# Patient Record
Sex: Male | Born: 1955 | Race: Black or African American | Hispanic: No | Marital: Single | Smoking: Former smoker
Health system: Southern US, Community
[De-identification: ages and names within clinical notes are randomized; demographics above are authoritative.]

## PROBLEM LIST (undated history)

## (undated) DIAGNOSIS — B1920 Unspecified viral hepatitis C without hepatic coma: Secondary | ICD-10-CM

## (undated) DIAGNOSIS — F419 Anxiety disorder, unspecified: Secondary | ICD-10-CM

## (undated) DIAGNOSIS — F329 Major depressive disorder, single episode, unspecified: Secondary | ICD-10-CM

## (undated) DIAGNOSIS — M1990 Unspecified osteoarthritis, unspecified site: Secondary | ICD-10-CM

## (undated) DIAGNOSIS — F4310 Post-traumatic stress disorder, unspecified: Secondary | ICD-10-CM

## (undated) DIAGNOSIS — G629 Polyneuropathy, unspecified: Secondary | ICD-10-CM

## (undated) DIAGNOSIS — F32A Depression, unspecified: Secondary | ICD-10-CM

## (undated) HISTORY — DX: Polyneuropathy, unspecified: G62.9

## (undated) HISTORY — PX: NASAL SINUS SURGERY: SHX719

## (undated) HISTORY — PX: OTHER SURGICAL HISTORY: SHX169

## (undated) HISTORY — DX: Unspecified osteoarthritis, unspecified site: M19.90

---

## 1998-07-16 ENCOUNTER — Emergency Department (HOSPITAL_COMMUNITY): Admission: EM | Admit: 1998-07-16 | Discharge: 1998-07-16 | Payer: Self-pay | Admitting: Emergency Medicine

## 1999-12-02 ENCOUNTER — Inpatient Hospital Stay (HOSPITAL_COMMUNITY): Admission: EM | Admit: 1999-12-02 | Discharge: 1999-12-05 | Payer: Self-pay | Admitting: Emergency Medicine

## 1999-12-02 ENCOUNTER — Encounter: Payer: Self-pay | Admitting: Orthopedic Surgery

## 1999-12-03 ENCOUNTER — Encounter: Payer: Self-pay | Admitting: Orthopedic Surgery

## 2000-05-14 ENCOUNTER — Encounter: Payer: Self-pay | Admitting: Neurosurgery

## 2000-05-14 ENCOUNTER — Ambulatory Visit (HOSPITAL_COMMUNITY): Admission: RE | Admit: 2000-05-14 | Discharge: 2000-05-14 | Payer: Self-pay | Admitting: Neurosurgery

## 2000-08-27 ENCOUNTER — Encounter: Payer: Self-pay | Admitting: Orthopedic Surgery

## 2000-08-27 ENCOUNTER — Ambulatory Visit (HOSPITAL_COMMUNITY): Admission: RE | Admit: 2000-08-27 | Discharge: 2000-08-27 | Payer: Self-pay | Admitting: Orthopedic Surgery

## 2001-08-01 ENCOUNTER — Encounter: Payer: Self-pay | Admitting: Family Medicine

## 2001-08-01 ENCOUNTER — Ambulatory Visit (HOSPITAL_COMMUNITY): Admission: RE | Admit: 2001-08-01 | Discharge: 2001-08-01 | Payer: Self-pay | Admitting: Family Medicine

## 2001-08-10 HISTORY — PX: OTHER SURGICAL HISTORY: SHX169

## 2003-08-11 HISTORY — PX: KNEE SURGERY: SHX244

## 2005-01-08 ENCOUNTER — Encounter: Admission: RE | Admit: 2005-01-08 | Discharge: 2005-01-08 | Payer: Self-pay | Admitting: Occupational Medicine

## 2005-06-23 ENCOUNTER — Ambulatory Visit: Payer: Self-pay | Admitting: Gastroenterology

## 2005-07-24 ENCOUNTER — Ambulatory Visit: Payer: Self-pay | Admitting: Gastroenterology

## 2005-08-28 ENCOUNTER — Ambulatory Visit: Payer: Self-pay | Admitting: Gastroenterology

## 2005-09-07 ENCOUNTER — Ambulatory Visit (HOSPITAL_COMMUNITY): Admission: RE | Admit: 2005-09-07 | Discharge: 2005-09-07 | Payer: Self-pay | Admitting: Gastroenterology

## 2005-09-07 ENCOUNTER — Encounter (INDEPENDENT_AMBULATORY_CARE_PROVIDER_SITE_OTHER): Payer: Self-pay | Admitting: *Deleted

## 2008-10-20 ENCOUNTER — Emergency Department (HOSPITAL_COMMUNITY): Admission: EM | Admit: 2008-10-20 | Discharge: 2008-10-20 | Payer: Self-pay | Admitting: Emergency Medicine

## 2008-12-22 ENCOUNTER — Emergency Department (HOSPITAL_COMMUNITY): Admission: EM | Admit: 2008-12-22 | Discharge: 2008-12-22 | Payer: Self-pay | Admitting: Emergency Medicine

## 2009-04-06 ENCOUNTER — Emergency Department (HOSPITAL_COMMUNITY): Admission: EM | Admit: 2009-04-06 | Discharge: 2009-04-07 | Payer: Self-pay | Admitting: Emergency Medicine

## 2009-09-27 ENCOUNTER — Emergency Department (HOSPITAL_COMMUNITY): Admission: EM | Admit: 2009-09-27 | Discharge: 2009-09-27 | Payer: Self-pay | Admitting: Family Medicine

## 2009-12-11 ENCOUNTER — Emergency Department (HOSPITAL_COMMUNITY): Admission: EM | Admit: 2009-12-11 | Discharge: 2009-12-11 | Payer: Self-pay | Admitting: Family Medicine

## 2009-12-17 ENCOUNTER — Emergency Department (HOSPITAL_COMMUNITY): Admission: EM | Admit: 2009-12-17 | Discharge: 2009-12-17 | Payer: Self-pay | Admitting: Family Medicine

## 2009-12-23 ENCOUNTER — Emergency Department (HOSPITAL_COMMUNITY): Admission: EM | Admit: 2009-12-23 | Discharge: 2009-12-23 | Payer: Self-pay | Admitting: Family Medicine

## 2010-01-18 ENCOUNTER — Emergency Department (HOSPITAL_COMMUNITY): Admission: EM | Admit: 2010-01-18 | Discharge: 2010-01-18 | Payer: Self-pay | Admitting: Family Medicine

## 2010-06-26 ENCOUNTER — Emergency Department (HOSPITAL_COMMUNITY): Admission: EM | Admit: 2010-06-26 | Discharge: 2010-06-26 | Payer: Self-pay | Admitting: Family Medicine

## 2010-09-13 ENCOUNTER — Inpatient Hospital Stay (INDEPENDENT_AMBULATORY_CARE_PROVIDER_SITE_OTHER)
Admission: RE | Admit: 2010-09-13 | Discharge: 2010-09-13 | Disposition: A | Discharge status: Home / Self Care | Payer: Self-pay | Source: Ambulatory Visit | Attending: Family Medicine | Admitting: Family Medicine

## 2010-09-13 DIAGNOSIS — J069 Acute upper respiratory infection, unspecified: Secondary | ICD-10-CM

## 2010-10-29 LAB — POCT URINALYSIS DIP (DEVICE)
Bilirubin Urine: NEGATIVE
Glucose, UA: NEGATIVE mg/dL
Ketones, ur: NEGATIVE mg/dL
Nitrite: NEGATIVE
Protein, ur: NEGATIVE mg/dL
Specific Gravity, Urine: 1.025 (ref 1.005–1.030)
Urobilinogen, UA: 0.2 mg/dL (ref 0.0–1.0)
pH: 5 (ref 5.0–8.0)

## 2010-11-06 ENCOUNTER — Inpatient Hospital Stay (INDEPENDENT_AMBULATORY_CARE_PROVIDER_SITE_OTHER)
Admission: RE | Admit: 2010-11-06 | Discharge: 2010-11-06 | Disposition: A | Discharge status: Home / Self Care | Payer: Self-pay | Source: Ambulatory Visit | Attending: Emergency Medicine | Admitting: Emergency Medicine

## 2010-11-06 ENCOUNTER — Ambulatory Visit (INDEPENDENT_AMBULATORY_CARE_PROVIDER_SITE_OTHER): Payer: Self-pay

## 2010-11-06 DIAGNOSIS — R6889 Other general symptoms and signs: Secondary | ICD-10-CM

## 2010-11-06 DIAGNOSIS — M1990 Unspecified osteoarthritis, unspecified site: Secondary | ICD-10-CM

## 2010-11-15 LAB — CBC
MCHC: 32.9 g/dL (ref 30.0–36.0)
MCV: 89.2 fL (ref 78.0–100.0)
Platelets: 217 10*3/uL (ref 150–400)
RBC: 4.13 MIL/uL — ABNORMAL LOW (ref 4.22–5.81)
RDW: 12.6 % (ref 11.5–15.5)

## 2010-11-15 LAB — URINE CULTURE: Colony Count: >=100000

## 2010-11-15 LAB — URINALYSIS, ROUTINE W REFLEX MICROSCOPIC
Ketones, ur: NEGATIVE mg/dL
Nitrite: NEGATIVE
Specific Gravity, Urine: 1.018 (ref 1.005–1.030)
Urobilinogen, UA: 0.2 mg/dL (ref 0.0–1.0)
pH: 6 (ref 5.0–8.0)

## 2010-11-15 LAB — DIFFERENTIAL
Basophils Absolute: 0 10*3/uL (ref 0.0–0.1)
Basophils Relative: 0 % (ref 0–1)
Eosinophils Absolute: 0 10*3/uL (ref 0.0–0.7)
Neutro Abs: 12.8 10*3/uL — ABNORMAL HIGH (ref 1.7–7.7)
Neutrophils Relative %: 88 % — ABNORMAL HIGH (ref 43–77)

## 2010-11-15 LAB — GC/CHLAMYDIA PROBE AMP, URINE: GC Probe Amp, Urine: NEGATIVE

## 2010-11-15 LAB — POCT I-STAT, CHEM 8
Chloride: 101 mEq/L (ref 96–112)
Glucose, Bld: 101 mg/dL — ABNORMAL HIGH (ref 70–99)
HCT: 39 % (ref 39.0–52.0)
Hemoglobin: 13.3 g/dL (ref 13.0–17.0)
Potassium: 4 mEq/L (ref 3.5–5.1)
Sodium: 138 mEq/L (ref 135–145)

## 2010-11-15 LAB — URINE MICROSCOPIC-ADD ON

## 2010-12-26 NOTE — Discharge Summary (Signed)
[facility]  Patient:    Manuel Brady, Manuel Brady                       MRN: [id number] Adm. Date:  [id number] Disc. Date: [id number] Attending:  Loanne Drilling Dictator:   Grayland Jack, P.A.                           Discharge Summary  ADMITTING DIAGNOSIS:  Left upper extremity crush injury with both-bone forearm fracture and humerus fracture.  DISCHARGE DIAGNOSIS:  Status post open reduction internal fixation of left both-bone forearm fracture and left humerus fracture.  PROCEDURES:  1. Open reduction internal fixation of left both-bone forearm fracture.  2. Open reduction internal fixation of left humerus fracture.  SURGEON:  Ollen Gross, M.D.  ASSISTANT:  Ralene Bathe, P.A.-C.  CONSULTS:  None.  BRIEF HISTORY:  Manuel Brady is a 55 year old male who was at work when he got his left arm caught in a roller machine.  He sustained a comminuted both-bone forearm fracture, as well as a comminuted left humerus fracture.  This was consistent with a floating elbow.  It was felt that he would best benefit from surgical intervention.  Risks, benefits, and complications of surgery were discussed with the patient in detail and he agreed to proceed.  HOSPITAL COURSE:  On December 03, 1999 the patient underwent the above-stated procedure, which he tolerated well without complications.  The patient had some  postoperative pain control issues.  On postop day #1, the patient was fitted for a Sarmiento brace.  On December 05, 1999, the patient still had some moderate complaints of pain but was tolerable with p.o. analgesics.  He was afebrile and his vital signs were stable.  He was neurovascularly intact to the left upper extremity. The Sarmiento brace had a proper fit and was well tolerated by the patient.  It was  felt that he was stable for discharge at this time.  LABORATORY:  Preoperative PT and INR were within normal limits at 14.0 and 1.2,  respectively.   Preoperative PTT was 26 seconds.  Preoperative chemistries were within normal limits with a slightly decreased potassium at 3.1.  Preoperative BC was within normal limits.  Radiology:  Postoperative x-rays of the left forearm reveal near anatomic alignment of the radius and ulna after side plate application.  Postoperative films of the left humerus reveal near anatomic alignment.  CONDITION ON DISCHARGE:  Improved and stable.  DISCHARGE PLANS:  The patient is being discharged to home.  He is to have no active range of motion of the left shoulder.  He has full active range of motion of the left fingers.  He is to resume home diet.  He is to resume home medications.  DISCHARGE MEDICATIONS:  1. Percocet 5 mg #40.  2. Robaxin 500 mg #30.  DISCHARGE INSTRUCTIONS:  The patient may shower if he is able to keep the left upper extremity splint dry.  FOLLOWUP:  He is to return to the clinic in one week to follow up with Dr. Lequita Halt. Please call [phone] in the interim if there are any questions or concerns. DD:  12/05/99 TD:  12/07/99 Job: [id number]

## 2011-09-11 ENCOUNTER — Ambulatory Visit
Admission: RE | Admit: 2011-09-11 | Discharge: 2011-09-11 | Disposition: A | Discharge status: Home / Self Care | Payer: Self-pay | Source: Ambulatory Visit | Attending: Family Medicine | Admitting: Family Medicine

## 2011-09-11 ENCOUNTER — Other Ambulatory Visit: Payer: Self-pay | Admitting: Family Medicine

## 2011-09-11 ENCOUNTER — Ambulatory Visit
Admission: RE | Admit: 2011-09-11 | Discharge: 2011-09-11 | Disposition: A | Discharge status: Home / Self Care | Payer: Commercial Indemnity | Source: Ambulatory Visit | Attending: Family Medicine | Admitting: Family Medicine

## 2011-09-11 DIAGNOSIS — R52 Pain, unspecified: Secondary | ICD-10-CM

## 2013-02-11 ENCOUNTER — Emergency Department (HOSPITAL_BASED_OUTPATIENT_CLINIC_OR_DEPARTMENT_OTHER)
Admission: EM | Admit: 2013-02-11 | Discharge: 2013-02-11 | Disposition: A | Discharge status: Home / Self Care | Payer: BC Managed Care – PPO | Attending: Emergency Medicine | Admitting: Emergency Medicine

## 2013-02-11 ENCOUNTER — Encounter (HOSPITAL_BASED_OUTPATIENT_CLINIC_OR_DEPARTMENT_OTHER): Payer: Self-pay | Admitting: *Deleted

## 2013-02-11 DIAGNOSIS — R112 Nausea with vomiting, unspecified: Secondary | ICD-10-CM | POA: Insufficient documentation

## 2013-02-11 DIAGNOSIS — R51 Headache: Secondary | ICD-10-CM | POA: Insufficient documentation

## 2013-02-11 DIAGNOSIS — R6883 Chills (without fever): Secondary | ICD-10-CM | POA: Insufficient documentation

## 2013-02-11 DIAGNOSIS — K047 Periapical abscess without sinus: Secondary | ICD-10-CM | POA: Insufficient documentation

## 2013-02-11 DIAGNOSIS — R220 Localized swelling, mass and lump, head: Secondary | ICD-10-CM | POA: Insufficient documentation

## 2013-02-11 DIAGNOSIS — Z87891 Personal history of nicotine dependence: Secondary | ICD-10-CM | POA: Insufficient documentation

## 2013-02-11 DIAGNOSIS — R1110 Vomiting, unspecified: Secondary | ICD-10-CM

## 2013-02-11 DIAGNOSIS — F1410 Cocaine abuse, uncomplicated: Secondary | ICD-10-CM | POA: Insufficient documentation

## 2013-02-11 DIAGNOSIS — H9209 Otalgia, unspecified ear: Secondary | ICD-10-CM | POA: Insufficient documentation

## 2013-02-11 MED ORDER — PENICILLIN G BENZATHINE 1200000 UNIT/2ML IM SUSP
1.2000 10*6.[IU] | Freq: Once | INTRAMUSCULAR | Status: AC
  Administered 2013-02-11: 1.2 10*6.[IU] via INTRAMUSCULAR
  Filled 2013-02-11: qty 2

## 2013-02-11 MED ORDER — PENICILLIN V POTASSIUM 500 MG PO TABS: 500.0000 mg | ORAL_TABLET | Freq: Four times a day (QID) | ORAL | Status: DC

## 2013-02-11 MED ORDER — ACETAMINOPHEN 325 MG PO TABS
650.0000 mg | ORAL_TABLET | Freq: Once | ORAL | Status: AC
  Administered 2013-02-11: 650 mg via ORAL

## 2013-02-11 MED ORDER — ONDANSETRON 8 MG PO TBDP
8.0000 mg | ORAL_TABLET | Freq: Once | ORAL | Status: AC
  Administered 2013-02-11: 8 mg via ORAL
  Filled 2013-02-11: qty 1

## 2013-02-11 MED ORDER — PENICILLIN V POTASSIUM 250 MG PO TABS
500.0000 mg | ORAL_TABLET | Freq: Once | ORAL | Status: AC
  Administered 2013-02-11: 500 mg via ORAL
  Filled 2013-02-11: qty 2

## 2013-02-11 MED ORDER — TRAMADOL HCL 50 MG PO TABS: 50.0000 mg | ORAL_TABLET | Freq: Four times a day (QID) | ORAL | Status: DC | PRN

## 2013-02-11 MED ORDER — ONDANSETRON 8 MG PO TBDP: 8.0000 mg | ORAL_TABLET | Freq: Three times a day (TID) | ORAL | Status: DC | PRN

## 2013-02-11 MED ORDER — TRAMADOL HCL 50 MG PO TABS
50.0000 mg | ORAL_TABLET | Freq: Once | ORAL | Status: AC
  Administered 2013-02-11: 50 mg via ORAL
  Filled 2013-02-11: qty 1

## 2013-02-11 MED ORDER — ACETAMINOPHEN 325 MG PO TABS
ORAL_TABLET | ORAL | Status: AC
  Administered 2013-02-11: 650 mg via ORAL
  Filled 2013-02-11: qty 2

## 2013-02-11 NOTE — ED Notes (Signed)
Pt states he was seen here last p.m. and dx'd with abscessed tooth. Given Penicillin and Tramadol and now states he is having N/V, cold sweats and chills.

## 2013-02-11 NOTE — ED Provider Notes (Signed)
   History    CSN: [id number] Arrival date & time 02/11/13  4098  First MD Initiated Contact with Patient 02/11/13 0259     Chief Complaint  Patient presents with  . Headache   (Consider location/radiation/quality/duration/timing/severity/associated sxs/prior Treatment) Patient is a 57 y.o. male presenting with headaches.  Headache  Pt brought to the ED from [facility] for complaints of R lower toothache for the last 3-4 days, moderate to severe associated with earache and headache. Some facial swelling, but no fever.   History reviewed. No pertinent past medical history. Past Surgical History  Procedure Laterality Date  . Arm surgery    . Knee surgery     No family history on file. History  Substance Use Topics  . Smoking status: Former Games developer  . Smokeless tobacco: Not on file  . Alcohol Use: No     Comment: former    Review of Systems  Neurological: Positive for headaches.   All other systems reviewed and are negative except as noted in HPI.   Allergies  Review of patient's allergies indicates no known allergies.  Home Medications   Current Outpatient Rx  Name  Route  Sig  Dispense  Refill  . ibuprofen (ADVIL,MOTRIN) 200 MG tablet   Oral   Take 800 mg by mouth every 6 (six) hours as needed for pain.          BP 158/112  Pulse 73  Temp(Src) 98.2 F (36.8 C) (Oral)  Ht 6\' 1"  (1.854 m)  Wt 208 lb (94.348 kg)  BMI 27.45 kg/m2  SpO2 99% Physical Exam  Nursing note and vitals reviewed. Constitutional: He is oriented to person, place, and time. He appears well-developed and well-nourished.  HENT:  Head: Normocephalic and atraumatic.  Gingival swelling and erythema adjacent to R lower 1st molar, no palpable abscess  Eyes: EOM are normal. Pupils are equal, round, and reactive to light.  Neck: Normal range of motion. Neck supple.  Cardiovascular: Normal rate, normal heart sounds and intact distal pulses.   Pulmonary/Chest: Effort normal and breath sounds normal.   Abdominal: Bowel sounds are normal. He exhibits no distension. There is no tenderness.  Musculoskeletal: Normal range of motion. He exhibits no edema and no tenderness.  Lymphadenopathy:    He has no cervical adenopathy.  Neurological: He is alert and oriented to person, place, and time. He has normal strength. No cranial nerve deficit or sensory deficit.  Skin: Skin is warm and dry. No rash noted.  Psychiatric: He has a normal mood and affect.    ED Course  Procedures (including critical care time) Labs Reviewed - No data to display No results found. 1. Dental abscess     MDM  Pt given Toradol and PCN. Advised to follow up with Dentist although there is no dental coverage for ED patients tonight.   Shanai Lartigue B. Bernette Mayers, MD 02/11/13 0330

## 2013-02-11 NOTE — ED Notes (Signed)
C/o headache x 3 days- c/o right lower toothache, facial swelling and right ear pain x 2 days

## 2013-02-11 NOTE — ED Notes (Signed)
MD at bedside. 

## 2013-02-11 NOTE — ED Provider Notes (Signed)
History  This chart was scribed for Toy Baker, MD by Greggory Stallion, ED Scribe. This patient was seen in room MH06/MH06 and the patient's care was started at 10:03 PM.  CSN: [id number] Arrival date & time 02/11/13  2131   Chief Complaint  Patient presents with  . Medication Reaction   The history is provided by the patient. No language interpreter was used.    HPI Comments: Manuel Brady is a 57 y.o. male who presents to the Emergency Department complaining of medication reaction. Pt states he was seen here last night and diagnosed with an abscessed tooth. He states he was given Penicillin and tramadol and has now started to have HA, nausea, emesis, cold sweats, and chills. Pt states he doesn't know if he had a fever earlier or not. He states he is currently staying at [facility] for cocaine use.    History reviewed. No pertinent past medical history. Past Surgical History  Procedure Laterality Date  . Arm surgery    . Knee surgery     History reviewed. No pertinent family history. History  Substance Use Topics  . Smoking status: Former Games developer  . Smokeless tobacco: Not on file  . Alcohol Use: No     Comment: former    Review of Systems  Constitutional: Positive for chills.  Gastrointestinal: Positive for nausea and vomiting.  Neurological: Positive for headaches.  All other systems reviewed and are negative.    Allergies  Review of patient's allergies indicates no known allergies.  Home Medications   Current Outpatient Rx  Name  Route  Sig  Dispense  Refill  . ibuprofen (ADVIL,MOTRIN) 200 MG tablet   Oral   Take 800 mg by mouth every 6 (six) hours as needed for pain.         Marland Kitchen penicillin v potassium (VEETID) 500 MG tablet   Oral   Take 1 tablet (500 mg total) by mouth 4 (four) times daily.   28 tablet   0   . traMADol (ULTRAM) 50 MG tablet   Oral   Take 1 tablet (50 mg total) by mouth every 6 (six) hours as needed for pain.   15 tablet   0    BP  157/106  Pulse 87  Temp(Src) 99 F (37.2 C) (Oral)  Resp 18  Ht 6\' 1"  (1.854 m)  Wt 208 lb (94.348 kg)  BMI 27.45 kg/m2  SpO2 99%  Physical Exam  Nursing note and vitals reviewed. Constitutional: He is oriented to person, place, and time. He appears well-developed and well-nourished.  Non-toxic appearance. No distress.  HENT:  Head: Normocephalic and atraumatic.  No signs of draining abscess.   Eyes: Conjunctivae, EOM and lids are normal. Pupils are equal, round, and reactive to light.  Neck: Normal range of motion. Neck supple. No tracheal deviation present. No mass present.  No submental lymphadenopathy.   Cardiovascular: Normal rate, regular rhythm and normal heart sounds.  Exam reveals no gallop.   No murmur heard. Pulmonary/Chest: Effort normal and breath sounds normal. No stridor. No respiratory distress. He has no decreased breath sounds. He has no wheezes. He has no rhonchi. He has no rales.  Abdominal: Soft. Normal appearance and bowel sounds are normal. He exhibits no distension. There is no tenderness. There is no rebound, no guarding and no CVA tenderness.  Musculoskeletal: Normal range of motion. He exhibits no edema and no tenderness.  Lymphadenopathy:    He has no cervical adenopathy.  Neurological: He is  alert and oriented to person, place, and time. He has normal strength. No cranial nerve deficit or sensory deficit. GCS eye subscore is 4. GCS verbal subscore is 5. GCS motor subscore is 6.  Skin: Skin is warm and dry. No abrasion and no rash noted.  Psychiatric: He has a normal mood and affect. His speech is normal and behavior is normal.    ED Course  Procedures (including critical care time)  DIAGNOSTIC STUDIES: Oxygen Saturation is 99% on RA, normal by my interpretation.    COORDINATION OF CARE: 10:21 PM-Discussed treatment plan which includes a shot of penicillin here with pt at bedside and pt agreed to plan.   Labs Reviewed - No data to display No  results found. No diagnosis found.  MDM  Patient given Zofran and feels better. Given Bicillin for his tooth infection and he is stable for discharge       I personally performed the services described in this documentation, which was scribed in my presence. The recorded information has been reviewed and is accurate.    Toy Baker, MD 02/11/13 [phone]

## 2013-11-18 ENCOUNTER — Encounter (HOSPITAL_COMMUNITY): Payer: Self-pay | Admitting: Emergency Medicine

## 2013-11-18 ENCOUNTER — Emergency Department (HOSPITAL_COMMUNITY)
Admission: EM | Admit: 2013-11-18 | Discharge: 2013-11-18 | Disposition: A | Discharge status: Home / Self Care | Payer: Commercial Indemnity | Attending: Emergency Medicine | Admitting: Emergency Medicine

## 2013-11-18 DIAGNOSIS — Z8619 Personal history of other infectious and parasitic diseases: Secondary | ICD-10-CM | POA: Insufficient documentation

## 2013-11-18 DIAGNOSIS — R61 Generalized hyperhidrosis: Secondary | ICD-10-CM | POA: Insufficient documentation

## 2013-11-18 DIAGNOSIS — N509 Disorder of male genital organs, unspecified: Secondary | ICD-10-CM | POA: Insufficient documentation

## 2013-11-18 DIAGNOSIS — R350 Frequency of micturition: Secondary | ICD-10-CM | POA: Insufficient documentation

## 2013-11-18 DIAGNOSIS — M549 Dorsalgia, unspecified: Secondary | ICD-10-CM | POA: Insufficient documentation

## 2013-11-18 DIAGNOSIS — Z87891 Personal history of nicotine dependence: Secondary | ICD-10-CM | POA: Insufficient documentation

## 2013-11-18 HISTORY — DX: Unspecified viral hepatitis C without hepatic coma: B19.20

## 2013-11-18 LAB — BASIC METABOLIC PANEL
BUN: 12 mg/dL (ref 6–23)
CALCIUM: 9 mg/dL (ref 8.4–10.5)
CO2: 25 meq/L (ref 19–32)
CREATININE: 0.77 mg/dL (ref 0.50–1.35)
Chloride: 102 mEq/L (ref 96–112)
GFR calc Af Amer: >90 mL/min (ref >90)
GFR calc non Af Amer: >90 mL/min (ref >90)
GLUCOSE: 101 mg/dL — AB (ref 70–99)
Potassium: 3.6 mEq/L — ABNORMAL LOW (ref 3.7–5.3)
Sodium: 140 mEq/L (ref 137–147)

## 2013-11-18 LAB — URINALYSIS, ROUTINE W REFLEX MICROSCOPIC
Bilirubin Urine: NEGATIVE
GLUCOSE, UA: NEGATIVE mg/dL
HGB URINE DIPSTICK: NEGATIVE
KETONES UR: NEGATIVE mg/dL
Leukocytes, UA: NEGATIVE
Nitrite: NEGATIVE
PROTEIN: NEGATIVE mg/dL
Specific Gravity, Urine: 1.014 (ref 1.005–1.030)
Urobilinogen, UA: 0.2 mg/dL (ref 0.0–1.0)
pH: 6.5 (ref 5.0–8.0)

## 2013-11-18 LAB — CBC WITH DIFFERENTIAL/PLATELET
BASOS ABS: 0 10*3/uL (ref 0.0–0.1)
Basophils Relative: 0 % (ref 0–1)
EOS PCT: 2 % (ref 0–5)
Eosinophils Absolute: 0.1 10*3/uL (ref 0.0–0.7)
HEMATOCRIT: 41.7 % (ref 39.0–52.0)
Hemoglobin: 14 g/dL (ref 13.0–17.0)
LYMPHS PCT: 48 % — AB (ref 12–46)
Lymphs Abs: 2.7 10*3/uL (ref 0.7–4.0)
MCH: 29.3 pg (ref 26.0–34.0)
MCHC: 33.6 g/dL (ref 30.0–36.0)
MCV: 87.2 fL (ref 78.0–100.0)
MONO ABS: 0.5 10*3/uL (ref 0.1–1.0)
MONOS PCT: 8 % (ref 3–12)
Neutro Abs: 2.4 10*3/uL (ref 1.7–7.7)
Neutrophils Relative %: 42 % — ABNORMAL LOW (ref 43–77)
Platelets: 203 10*3/uL (ref 150–400)
RBC: 4.78 MIL/uL (ref 4.22–5.81)
RDW: 12.9 % (ref 11.5–15.5)
WBC: 5.7 10*3/uL (ref 4.0–10.5)

## 2013-11-18 NOTE — ED Provider Notes (Signed)
Medical screening examination/treatment/procedure(s) were performed by non-physician practitioner and as supervising physician I was immediately available for consultation/collaboration.   EKG Interpretation None      Djuana Littleton, MD, FACEP   Kadien Lineman L Tynisa Vohs, MD 11/18/13 0832 

## 2013-11-18 NOTE — Discharge Instructions (Signed)
Follow up with a primary care provider from the resource guide below. Refer to attached documents for more information. Return to the ED with worsening or concerning symptoms.    Emergency Department Resource Guide 1) Find a Doctor and Pay Out of Pocket Although you won't have to find out who is covered by your insurance plan, it is a good idea to ask around and get recommendations. You will then need to call the office and see if the doctor you have chosen will accept you as a new patient and what types of options they offer for patients who are self-pay. Some doctors offer discounts or will set up payment plans for their patients who do not have insurance, but you will need to ask so you aren't surprised when you get to your appointment.  2) Contact Your Local Health Department Not all health departments have doctors that can see patients for sick visits, but many do, so it is worth a call to see if yours does. If you don't know where your local health department is, you can check in your phone book. The CDC also has a tool to help you locate your state's health department, and many state websites also have listings of all of their local health departments.  3) Find a [facility] If your illness is not likely to be very severe or complicated, you may want to try a walk in clinic. These are popping up all over the country in pharmacies, drugstores, and shopping centers. They're usually staffed by nurse practitioners or physician assistants that have been trained to treat common illnesses and complaints. They're usually fairly quick and inexpensive. However, if you have serious medical issues or chronic medical problems, these are probably not your best option.  No Primary Care Doctor: - Call Health Connect at  [phone] - they can help you locate a primary care doctor that  accepts your insurance, provides certain services, etc. - Physician Referral Service- [phone]  Chronic Pain  Problems: Organization         Address  Phone   Notes  [facility]  [phone] Patients need to be referred by their primary care doctor.   Medication Assistance: Organization         Address  Phone   Notes  [facility] Medication [facility] [address] [place], [place] [address] [phone] --Must be a resident of [facility] -- Must have NO insurance coverage whatsoever (no Medicaid/ Medicare, etc.) -- The pt. MUST have a primary care doctor that directs their care regularly and follows them in the community   MedAssist  [phone]   [employer]  [phone]    Agencies that provide inexpensive medical care: Organization         Address  Phone   Notes  [place]  [phone]   [facility]    [phone]   [facility] [place], [place] [address] [phone]   [place] [address] [place] [phone]   Planned Parenthood    [phone]   [facility]    [phone]   [place] and [place] [address], [place] Phone:  [phone], Fax:  [phone] Hours of Operation:  9 am - 6 pm, M-F.  Also accepts Medicaid/Medicare and self-pay.  [facility] for [place] [address]  [place], [place], [place] Phone: [phone], Fax: [phone]. Hours of Operation:  8:30 am - 5:30 pm, M-F.  Also accepts Medicaid and self-pay.  [facility] [place] [address], [place] Phone: [phone]   [place], [place], [place] [phone], Ext. [phone] Mondays & Thursdays: 7-9 AM.  First 15 patients are seen on a first come, first serve basis.    [place] Providers:  Organization         Address  Phone   Notes  [facility] [address], [place] [phone] Also  accepts self-pay patients.  [facility] [address], [place]  [phone]   [place], [address], [place] [phone]   [facility] [address] [place] [phone]   Lucianne Lei [address], [place]   [phone] Only accepts [place] Access [place] patients after they have their name applied to their card.   Self-Pay (no insurance) in [facility]:  Organization         Address  Phone   Notes  Sickle Cell Patients, [facility] [place] [phone]   [facility] Care [place] [phone]   [facility] [place]  [place], [place],  [phone]   [facility]/Dr. Osei-Bonsu  [address] [place] or [address], [place] [phone] Phone number for both [place] and [place] locations is the same.  Urgent Medical and [facility] [address], [place] [phone]   [facility] [address] [place] or [address] [phone]   [facility] [address], [place] [phone], phone; [phone], fax Sees patients 1st and 3rd Saturday of every month.  Must not qualify for public or private insurance (i.e. Medicaid, Medicare, [place] Health Choice, Veterans' Benefits)  Household income should be no more than 200% of the poverty level The clinic cannot treat you if you are pregnant or think you are pregnant  Sexually transmitted diseases are not treated at the clinic.    Dental Care: Organization         Address  Phone  Notes  [facility] Department of [facility] [place] [phone] Accepts children up to age 34 who are enrolled in [place] or [place]; pregnant  women with a Medicaid card; and children who have applied for Medicaid or [place] Health Choice, but were declined, whose parents can pay a reduced fee at time of service.  [facility] Department of [facility]  [address], [place] [phone] Accepts children up to age 24 who are enrolled in [place] or [place]; pregnant women with a Medicaid card; and children who have applied for Medicaid or [place] Health Choice, but were declined, whose parents can pay a reduced fee at time of service.  [facility] Adult Dental Access PROGRAM  [place] [phone] Patients are seen by appointment only. Walk-ins are not accepted. [place] will see patients 57 years of age and older. Monday - Tuesday (8am-5pm) Most Wednesdays (8:30-5pm) $30 per visit, cash only  [place]  [address], [place] [phone] Patients are seen by appointment only. Walk-ins are not accepted. [place] will see patients 47 years of age and older. One Wednesday Evening (Monthly: Volunteer Based).  $30 per visit, cash only  [place]  [phone] for adults; Children under age 20, call Graduate Pediatric Dentistry at [phone]. Children aged 75-14, please call [phone] to request a pediatric application.  Dental services are provided in all areas of dental care including fillings, crowns and bridges, complete and partial dentures, implants, gum treatment, root canals, and extractions. Preventive care is also provided. Treatment is provided to both adults and children. Patients are selected via a lottery and there is often a waiting list.   [facility] [address], [place]  [phone] [email or web address]   [facility] [address] [place], [place] [phone], Ext. [phone] Second and Fourth Thursday of each month, opens at 6:30 AM; Clinic ends at 9 AM.  Patients are  seen on a first-come first-served basis, and a limited number are seen during each clinic.   [facility]  [address] Hillard Danker [place], [place] [phone]   Eligibility Requirements You must have lived in [place], [place], or [place] counties for at least the last three months.   You cannot be eligible for state or federal sponsored [employer], including [employer], [place], or [employer].   You generally cannot be eligible for healthcare insurance through your employer.    How to apply: Eligibility screenings are held every Tuesday and Wednesday afternoon from 1:00 pm until 4:00 pm. You do not need an appointment for the interview!  [facility] [place] [address] [place], [place]   [place]  [place] Department  [place]  [phone]    Behavioral Health Resources in the Community: Intensive Outpatient Programs Organization         Address  Phone  Notes  [place]. [address] [place], [place] [phone]   [facility] Outpatient [address] [place], [place]   ADS: Alcohol & Drug Svcs [address] [place], [place]   [place] [address]  [place], [place] or [phone]   Substance Abuse Resources Organization         Address  Phone  Notes  Alcohol and Drug Services  [phone]   [place]  [phone]   The [place]   Chinita Pester  [phone]   Residential & Outpatient Substance Abuse Program  [phone]   Psychological Services Organization         Address  Phone  Notes  [facility] [place]  [place]  [phone]   [place] [address] [place] or [phone]    Mobile Crisis  Teams Organization         Address  Phone  Notes  [facility]rapeutic Alternatives, Mobile Crisis Care Unit  [phone]   [facility]ive Psychotherapeutic Services  [address] [place], [place]   [place] [address] [place] [phone]    Self-Help/Support Groups Organization         Address  Phone             Notes  [place]. of [place] -  variety of support groups  [phone]- [phone] Call for more information  Narcotics Anonymous (NA), [facility] [address], [employer] [place]  2 meetings at this location   Residential Facilities manager         Address  Phone  Notes  [facility]idential Treatment [place],    [place]  [phone]   [facility]  [address] [place] 623762, [place], [place]   [place], [place] [phone] Admissions: 8am-3pm M-F  Incentives Substance [place] [address],    [place], [place] [phone]   [facility] [address] [place], [place], [place]   [facility] [address]  [place], [place]   Insight Programs - Intensive Outpatient [address]een [address], [place], [place]   [facility] [place] ([place].,  [place], [place] [phone] or [phone]   Residential Treatment Services (RTS) [address] [place], [place] Accepts Medicaid  [facility] [place] [address],  [place] [phone] Substance Abuse/Addiction Treatment   [facility] Organization         Address  Phone  Notes  [facility]  [phone]   Domenic Schwab, PhD [address] [place], [place]   [phone] or [phone]   [place], [place] [phone]   [facility] [address] [place], [place] [phone]  Insurance/Medicaid/sponsorship through [facility] and Families [address]                                    [place], [place] [phone] [place] [address], [place] [phone]    Dr. Adele Schilder  [phone]   [facility] Dept. 1) [address] [place] 2) [place] 3)  [place] [address], [place] [phone]  [phone]   [place] [phone] or [phone] (After Hours)

## 2013-11-18 NOTE — ED Notes (Signed)
Patient reports urinary frequency. Reports waking up in sweats for the last few nights. Denies Hematuria. States he does have some right back pain.

## 2013-11-18 NOTE — ED Provider Notes (Signed)
CSN: [id number]     Arrival date & time 11/18/13  3762 History   First MD Initiated Contact with Patient 11/18/13 [phone]     Chief Complaint  Patient presents with  . Urinary Tract Infection     (Consider location/radiation/quality/duration/timing/severity/associated sxs/prior Treatment) HPI Comments: Patient is a 58 year old male with a past medical history of hepatitis C who presents with increased urinary frequency for the past 3 days. Symptoms started gradually and progressively worsened since the onset. Patient reports noticing the increased frequency mostly at night. Patient reports some occasional, mild testicular pain and some right back pain. No aggravating/alleviating factors. No other associated symptoms. Patient denies any history of kidney stones.    Past Medical History  Diagnosis Date  . Hepatitis C    Past Surgical History  Procedure Laterality Date  . Arm surgery    . Knee surgery     No family history on file. History  Substance Use Topics  . Smoking status: Former Research scientist (life sciences)  . Smokeless tobacco: Not on file  . Alcohol Use: No     Comment: former    Review of Systems  Constitutional: Positive for diaphoresis. Negative for fever, chills and fatigue.  HENT: Negative for trouble swallowing.   Eyes: Negative for visual disturbance.  Respiratory: Negative for shortness of breath.   Cardiovascular: Negative for chest pain and palpitations.  Gastrointestinal: Negative for nausea, vomiting, abdominal pain and diarrhea.  Genitourinary: Positive for frequency. Negative for dysuria and difficulty urinating.  Musculoskeletal: Negative for arthralgias and neck pain.  Skin: Negative for color change.  Neurological: Negative for dizziness and weakness.  Psychiatric/Behavioral: Negative for dysphoric mood.      Allergies  Review of patient's allergies indicates no known allergies.  Home Medications   Current Outpatient Rx  Name  Route  Sig  Dispense  Refill  .  ibuprofen (ADVIL,MOTRIN) 200 MG tablet   Oral   Take 400 mg by mouth every 6 (six) hours as needed.         . Pseudoephedrine-Acetaminophen (SINUS PO)   Oral   Take 1 tablet by mouth 2 (two) times daily as needed (for sinuses).          BP 128/92  Pulse 72  Temp(Src) 97.6 F (36.4 C) (Oral)  Resp 16  SpO2 94% Physical Exam  Nursing note and vitals reviewed. Constitutional: He is oriented to person, place, and time. He appears well-developed and well-nourished. No distress.  HENT:  Head: Normocephalic and atraumatic.  Eyes: Conjunctivae and EOM are normal.  Neck: Normal range of motion.  Cardiovascular: Normal rate and regular rhythm.  Exam reveals no gallop and no friction rub.   No murmur heard. Pulmonary/Chest: Effort normal and breath sounds normal. He has no wheezes. He has no rales. He exhibits no tenderness.  Abdominal: Soft. He exhibits no distension. There is no tenderness. There is no rebound and no guarding.  No tenderness to palpation.   Genitourinary: Penis normal.  No CVA tenderness. No testicular tenderness to palpation or scrotal swelling noted.   Musculoskeletal: Normal range of motion.  Neurological: He is alert and oriented to person, place, and time. Coordination normal.  Speech is goal-oriented. Moves limbs without ataxia.   Skin: Skin is warm and dry.  Psychiatric: He has a normal mood and affect. His behavior is normal.    ED Course  Procedures (including critical care time) Labs Review Labs Reviewed  CBC WITH DIFFERENTIAL - Abnormal; Notable for the following:  Neutrophils Relative % 42 (*)    Lymphocytes Relative 48 (*)    All other components within normal limits  BASIC METABOLIC PANEL - Abnormal; Notable for the following:    Potassium 3.6 (*)    Glucose, Bld 101 (*)    All other components within normal limits  URINE CULTURE  URINALYSIS, ROUTINE W REFLEX MICROSCOPIC   Imaging Review No results found.   EKG Interpretation None       MDM   Final diagnoses:  Urinary frequency    8:19 AM Patient's labs and urinalysis unremarkable for acute changes. Patient reports mild pain. No testicular tenderness to palpation. Patient has no signs of infection. Patient does not want to have a CT abdomen pelvis for further evaluation of kidney stone. Patient will be discharged with instructions to follow up with a PCP from the resource guide. Patient advised to return with worsening or concerning symptoms.    Alvina Chou, PA-C 11/18/13 [address] [place], PA-C 11/18/13 [phone]

## 2013-11-19 LAB — URINE CULTURE
Colony Count: 4000
SPECIAL REQUESTS: NORMAL

## 2013-12-30 ENCOUNTER — Encounter (HOSPITAL_COMMUNITY): Payer: Self-pay | Admitting: Emergency Medicine

## 2013-12-30 ENCOUNTER — Emergency Department (HOSPITAL_COMMUNITY)
Admission: EM | Admit: 2013-12-30 | Discharge: 2013-12-30 | Disposition: A | Discharge status: Home / Self Care | Payer: Commercial Indemnity | Attending: Emergency Medicine | Admitting: Emergency Medicine

## 2013-12-30 DIAGNOSIS — W268XXA Contact with other sharp object(s), not elsewhere classified, initial encounter: Secondary | ICD-10-CM | POA: Insufficient documentation

## 2013-12-30 DIAGNOSIS — Z87891 Personal history of nicotine dependence: Secondary | ICD-10-CM | POA: Insufficient documentation

## 2013-12-30 DIAGNOSIS — L03113 Cellulitis of right upper limb: Secondary | ICD-10-CM

## 2013-12-30 DIAGNOSIS — Y939 Activity, unspecified: Secondary | ICD-10-CM | POA: Insufficient documentation

## 2013-12-30 DIAGNOSIS — S60559A Superficial foreign body of unspecified hand, initial encounter: Principal | ICD-10-CM

## 2013-12-30 DIAGNOSIS — Z8619 Personal history of other infectious and parasitic diseases: Secondary | ICD-10-CM | POA: Insufficient documentation

## 2013-12-30 DIAGNOSIS — S60551A Superficial foreign body of right hand, initial encounter: Secondary | ICD-10-CM

## 2013-12-30 DIAGNOSIS — Y990 Civilian activity done for income or pay: Secondary | ICD-10-CM | POA: Insufficient documentation

## 2013-12-30 DIAGNOSIS — Y929 Unspecified place or not applicable: Secondary | ICD-10-CM | POA: Insufficient documentation

## 2013-12-30 DIAGNOSIS — L089 Local infection of the skin and subcutaneous tissue, unspecified: Secondary | ICD-10-CM | POA: Insufficient documentation

## 2013-12-30 MED ORDER — CEPHALEXIN 500 MG PO CAPS: 500.0000 mg | ORAL_CAPSULE | Freq: Four times a day (QID) | ORAL | Status: DC

## 2013-12-30 MED ORDER — SULFAMETHOXAZOLE-TMP DS 800-160 MG PO TABS
1.0000 | ORAL_TABLET | Freq: Once | ORAL | Status: AC
  Administered 2013-12-30: 1 via ORAL
  Filled 2013-12-30: qty 1

## 2013-12-30 MED ORDER — SULFAMETHOXAZOLE-TRIMETHOPRIM 800-160 MG PO TABS: 1.0000 | ORAL_TABLET | Freq: Two times a day (BID) | ORAL | Status: DC

## 2013-12-30 MED ORDER — CEPHALEXIN 250 MG PO CAPS
500.0000 mg | ORAL_CAPSULE | Freq: Once | ORAL | Status: AC
  Administered 2013-12-30: 500 mg via ORAL
  Filled 2013-12-30: qty 2

## 2013-12-30 NOTE — ED Provider Notes (Signed)
CSN: [id number]     Arrival date & time 12/30/13  1634 History  This chart was scribed for non-physician practitioner working with Neta Ehlers, MD by Mercy Moore, ED Scribe. This patient was seen in room TR09C/TR09C and the patient's care was started at 5:16 PM.   Chief Complaint  Patient presents with  . Hand Injury      The history is provided by the patient. No language interpreter was used.   HPI Comments: Manuel Brady is a 58 y.o. male who presents to the Emergency Department with a right hand injury that occurred two days ago at work. Patient reports that two large wooden splinters punctured his hand though he was wearing gloves. Patient was able to remove one the splinters immediately following the accident. Patient treated his wound with rubbing alcohol and peroxide at home. Today the second splinter ermerged with some drainage. Patient presents with an open wound, swelling and redness at the puncture site. Patient is right hand dominant.   Patient shares he does not have insurance and is concerned about the cost of any antibiotics.   Last Tetanus vaccination received 1.5 years ago.   Past Medical History  Diagnosis Date  . Hepatitis C    Past Surgical History  Procedure Laterality Date  . Arm surgery    . Knee surgery     History reviewed. No pertinent family history. History  Substance Use Topics  . Smoking status: Former Research scientist (life sciences)  . Smokeless tobacco: Not on file  . Alcohol Use: No     Comment: former    Review of Systems  A complete 10 system review of systems was obtained and all systems are negative except as noted in the HPI and PMH.    Allergies  Review of patient's allergies indicates no known allergies.  Home Medications   Prior to Admission medications   Medication Sig Start Date End Date Taking? Authorizing Provider  ibuprofen (ADVIL,MOTRIN) 200 MG tablet Take 400 mg by mouth every 6 (six) hours as needed.    Historical Provider, MD   Pseudoephedrine-Acetaminophen (SINUS PO) Take 1 tablet by mouth 2 (two) times daily as needed (for sinuses).    Historical Provider, MD   Triage Vitals: BP 130/80  Pulse 84  Temp(Src) 98 F (36.7 C) (Oral)  Resp 18  Ht 6\' 2"  (1.88 m)  Wt 215 lb (97.523 kg)  BMI 27.59 kg/m2  SpO2 95% Physical Exam  Nursing note and vitals reviewed. Constitutional: He is oriented to person, place, and time. He appears well-developed and well-nourished. No distress.  HENT:  Head: Normocephalic.  Eyes: Conjunctivae and EOM are normal.  Cardiovascular: Normal rate.   Pulmonary/Chest: Effort normal. No stridor.  Musculoskeletal: Normal range of motion.       Hands: Excellent range of motion. Neurovascularly intact, compartments soft  Neurological: He is alert and oriented to person, place, and time.  Psychiatric: He has a normal mood and affect.    ED Course  Procedures (including critical care time)  INCISION AND DRAINAGE PROCEDURE NOTE: Patient identification was confirmed and verbal consent was obtained. This procedure was performed by Monico Blitz  at 6:05 PM. Site: right hand Sterile procedures observed  Anesthetic used (type and amt): 2% lidocaine with epinephrine, 2 mils used Blade size: 11 Drainage: Scant, no foreign body seen or palpated Complexity: Complex Packing used none Site anesthetized, incision made over site, wound drained and explored loculations, rinsed with copious amounts of normal saline, wound packed with sterile  gauze, covered with dry, sterile dressing.  Pt tolerated procedure well without complications.  Instructions for care discussed verbally and pt provided with additional written instructions for homecare and f/u.   DIAGNOSTIC STUDIES: Oxygen Saturation is 95% on room air, adequate by my interpretation.    COORDINATION OF CARE: 5:18 PM- Will prescribe antibiotics and refer to hand specialist. Patient advised to return to ED if his condition worsens.  Discussed treatment plan with patient at bedside and patient agreed to plan.    Labs Review Labs Reviewed - No data to display  Imaging Review No results found.   EKG Interpretation None      MDM   Final diagnoses:  Cellulitis of right hand excluding fingers and thumb  Foreign body of right hand    Filed Vitals:   12/30/13 1639 12/30/13 1812  BP: 130/80 120/80  Pulse: 84 76  Temp: 98 F (36.7 C) 97.5 F (36.4 C)  TempSrc: Oral Oral  Resp: 18   Height: 6\' 2"  (1.88 m)   Weight: 215 lb (97.523 kg)   SpO2: 95% 100%    Medications  cephALEXin (KEFLEX) capsule 500 mg (500 mg Oral Given 12/30/13 1746)  sulfamethoxazole-trimethoprim (BACTRIM DS) 800-160 MG per tablet 1 tablet (1 tablet Oral Given 12/30/13 1746)    Manuel Brady is a 58 y.o. male presenting with right hand swelling and pain. Patient has removed part of a wooden splinter. Wound opened and explored with no foreign body seen or palpated. Patient will be started on Keflex and Bactrim. Advised close followup with hand surgery or ED if symptoms worsen. Patient verbalizes his understanding.  Evaluation does not show pathology that would require ongoing emergent intervention or inpatient treatment. Pt is hemodynamically stable and mentating appropriately. Discussed findings and plan with patient/guardian, who agrees with care plan. All questions answered. Return precautions discussed and outpatient follow up given.   Discharge Medication List as of 12/30/2013  6:10 PM    START taking these medications   Details  cephALEXin (KEFLEX) 500 MG capsule Take 1 capsule (500 mg total) by mouth 4 (four) times daily., Starting 12/30/2013, Until Discontinued, Print    sulfamethoxazole-trimethoprim (SEPTRA DS) 800-160 MG per tablet Take 1 tablet by mouth every 12 (twelve) hours., Starting 12/30/2013, Until Discontinued, Print        Note: Portions of this report may have been transcribed using voice recognition software. Every  effort was made to ensure accuracy; however, inadvertent computerized transcription errors may be present   I personally performed the services described in this documentation, which was scribed in my presence. The recorded information has been reviewed and is accurate.    Monico Blitz, PA-C 12/31/13 [phone]

## 2013-12-30 NOTE — Discharge Instructions (Signed)
°  Take your antibiotics as directed and to completion. You should never have any leftover antibiotics! Push fluids and stay well hydrated.   Wash the affected area with soap and water and apply a thin layer of topical antibiotic ointment. Do this every 12 hours.   Do not use rubbing alcohol or hydrogen peroxide.                        Look for signs of infection: if you see redness, if the area becomes warm, if pain increases sharply, there is discharge (pus), if red streaks appear or you develop fever or vomiting, RETURN immediately to the Emergency Department  for a recheck.    Cellulitis Cellulitis is an infection of the skin and the tissue under the skin. The infected area is usually red and tender. This happens most often in the arms and lower legs. HOME CARE   Take your antibiotic medicine as told. Finish the medicine even if you start to feel better.  Keep the infected arm or leg raised (elevated).  Put a warm cloth on the area up to 4 times per day.  Only take medicines as told by your doctor.  Keep all doctor visits as told. GET HELP RIGHT AWAY IF:   You have a fever.  You feel very sleepy.  You throw up (vomit) or have watery poop (diarrhea).  You feel sick and have muscle aches and pains.  You see red streaks on the skin coming from the infected area.  Your red area gets bigger or turns a dark color.  Your bone or joint under the infected area is painful after the skin heals.  Your infection comes back in the same area or different area.  You have a puffy (swollen) bump in the infected area.  You have new symptoms. MAKE SURE YOU:   Understand these instructions.  Will watch your condition.  Will get help right away if you are not doing well or get worse. Document Released: 01/13/2008 Document Revised: 01/26/2012 Document Reviewed: 10/12/2011 [facility] Patient Information 2014 [place], [place].

## 2013-12-30 NOTE — ED Notes (Addendum)
Pt to ED for evaluation of right hand injury. Pt states he was lifting something and splinter pincher right hand. Open wound with swallowing and redness noted. Pt states he was able to remove a piece of would but not sure if all came out.

## 2013-12-31 NOTE — ED Provider Notes (Signed)
Medical screening examination/treatment/procedure(s) were performed by non-physician practitioner and as supervising physician I was immediately available for consultation/collaboration.   Neta Ehlers, MD 12/31/13 1040

## 2014-01-13 ENCOUNTER — Encounter (HOSPITAL_COMMUNITY): Payer: Self-pay | Admitting: Emergency Medicine

## 2014-01-13 ENCOUNTER — Emergency Department (HOSPITAL_COMMUNITY): Payer: Commercial Indemnity

## 2014-01-13 ENCOUNTER — Emergency Department (HOSPITAL_COMMUNITY)
Admission: EM | Admit: 2014-01-13 | Discharge: 2014-01-13 | Disposition: A | Discharge status: Home / Self Care | Payer: Commercial Indemnity | Attending: Emergency Medicine | Admitting: Emergency Medicine

## 2014-01-13 DIAGNOSIS — Z792 Long term (current) use of antibiotics: Secondary | ICD-10-CM | POA: Insufficient documentation

## 2014-01-13 DIAGNOSIS — Z87891 Personal history of nicotine dependence: Secondary | ICD-10-CM | POA: Insufficient documentation

## 2014-01-13 DIAGNOSIS — J069 Acute upper respiratory infection, unspecified: Secondary | ICD-10-CM | POA: Insufficient documentation

## 2014-01-13 DIAGNOSIS — Z8619 Personal history of other infectious and parasitic diseases: Secondary | ICD-10-CM | POA: Insufficient documentation

## 2014-01-13 MED ORDER — ALBUTEROL SULFATE HFA 108 (90 BASE) MCG/ACT IN AERS
2.0000 | INHALATION_SPRAY | Freq: Once | RESPIRATORY_TRACT | Status: AC
  Administered 2014-01-13: 2 via RESPIRATORY_TRACT
  Filled 2014-01-13: qty 6.7

## 2014-01-13 MED ORDER — NAPROXEN 500 MG PO TABS: 500.0000 mg | ORAL_TABLET | Freq: Two times a day (BID) | ORAL | Status: DC

## 2014-01-13 MED ORDER — HYDROCODONE-HOMATROPINE 5-1.5 MG/5ML PO SYRP: 5.0000 mL | ORAL_SOLUTION | Freq: Four times a day (QID) | ORAL | Status: DC | PRN

## 2014-01-13 NOTE — ED Notes (Signed)
Pt not taken out of system, discharged pt out of system.

## 2014-01-13 NOTE — Discharge Instructions (Signed)
Your x-rays showed no bony abnormalities concerning your clavicle. You likely have a viral upper respiratory infection. I am discharge you with 2 medications to help with your symptoms.  Read the instructions below on reasons to return to the emergency department and to learn more about your diagnosis.  Use over the counter medications for symptomatic relief as we discussed (musinex as a decongestant, Tylenol for fever/pain, Motrin/Ibuprofen for muscle aches). If prescribed a cough suppressant during your visit, do not operate heavy machinery with in 5 hours of taking this medication. Followup with your primary care doctor in 4 days if your symptoms persist.  Your more than welcome to return to the emergency department if symptoms worsen or become concerning.  Upper Respiratory Infection, Adult  An upper respiratory infection (URI) is also sometimes known as the common cold. Most people improve within 1 week, but symptoms can last up to 2 weeks. A residual cough may last even longer.   URI is most commonly caused by a virus. Viruses are NOT treated with antibiotics. You can easily spread the virus to others by oral contact. This includes kissing, sharing a glass, coughing, or sneezing. Touching your mouth or nose and then touching a surface, which is then touched by another person, can also spread the virus.   TREATMENT  Treatment is directed at relieving symptoms. There is no cure. Antibiotics are not effective, because the infection is caused by a virus, not by bacteria. Treatment may include:  Increased fluid intake. Sports drinks offer valuable electrolytes, sugars, and fluids.  Breathing heated mist or steam (vaporizer or shower).  Eating chicken soup or other clear broths, and maintaining good nutrition.  Getting plenty of rest.  Using gargles or lozenges for comfort.  Controlling fevers with ibuprofen or acetaminophen as directed by your caregiver.  Increasing usage of your inhaler if you have  asthma.  Return to work when your temperature has returned to normal.   SEEK MEDICAL CARE IF:  After the first few days, you feel you are getting worse rather than better.  You develop worsening shortness of breath, or brown or red sputum. These may be signs of pneumonia.  You develop yellow or brown nasal discharge or pain in the face, especially when you bend forward. These may be signs of sinusitis.  You develop a fever, swollen neck glands, pain with swallowing, or white areas in the back of your throat. These may be signs of strep throat.    Emergency Department Resource Guide 1) Find a Doctor and Pay Out of Pocket Although you won't have to find out who is covered by your insurance plan, it is a good idea to ask around and get recommendations. You will then need to call the office and see if the doctor you have chosen will accept you as a new patient and what types of options they offer for patients who are self-pay. Some doctors offer discounts or will set up payment plans for their patients who do not have insurance, but you will need to ask so you aren't surprised when you get to your appointment.  2) Contact Your Local Health Department Not all health departments have doctors that can see patients for sick visits, but many do, so it is worth a call to see if yours does. If you don't know where your local health department is, you can check in your phone book. The CDC also has a tool to help you locate your state's health department, and many state  websites also have listings of all of their local health departments.  3) Find a [facility] If your illness is not likely to be very severe or complicated, you may want to try a walk in clinic. These are popping up all over the country in pharmacies, drugstores, and shopping centers. They're usually staffed by nurse practitioners or physician assistants that have been trained to treat common illnesses and complaints. They're usually fairly  quick and inexpensive. However, if you have serious medical issues or chronic medical problems, these are probably not your best option.  No Primary Care Doctor: - Call Health Connect at  [phone] - they can help you locate a primary care doctor that  accepts your insurance, provides certain services, etc. - Physician Referral Service- [phone]  Chronic Pain Problems: Organization         Address  Phone   Notes  [facility]  [phone] Patients need to be referred by their primary care doctor.   Medication Assistance: Organization         Address  Phone   Notes  [facility] Medication [facility] [address] [place], [place] [address] [phone] --Must be a resident of [facility] -- Must have NO insurance coverage whatsoever (no Medicaid/ Medicare, etc.) -- The pt. MUST have a primary care doctor that directs their care regularly and follows them in the community   MedAssist  [phone]   [employer]  [phone]    Agencies that provide inexpensive medical care: Organization         Address  Phone   Notes  [place]  [phone]   [facility]    [phone]   [facility] [place], [place] [address] [phone]   [place] [address], [place] [phone]   Planned Parenthood    [phone]   [facility]    [phone]   [place] [address], [place] Phone:  [phone], Fax:  [phone] Hours of Operation:  9 am - 6 pm, M-F.  Also accepts Medicaid/Medicare and self-pay.  [facility] for [place] [address], [address], [place] Phone: [phone], Fax: [phone]. Hours of Operation:  8:30 am - 5:30 pm, M-F.  Also accepts Medicaid and self-pay.  [facility] [place] [address], [place] Phone: [phone]    [place], [place], [place] [phone], Ext. [phone] Mondays & Thursdays: 7-9 AM.  First 15 patients are seen on a first come, first serve basis.    [place] Providers:  Organization         Address  Phone   Notes  [facility] [address], [place] [phone] Also accepts self-pay patients.  [facility] [address], [place]  [phone]   [place], [address], [place] [phone]   [facility] [address], [place] [phone]   Lucianne Lei [address], [place]   [phone] Only accepts [place] Access [place] patients after they have their name applied to their card.   Self-Pay (no insurance) in [place]:  [employer]  Phone   Notes  Sickle Cell Patients, [facility] [place] [phone]   [facility]gent [facility] [place] [phone]   [facility] [place]  [place], [address], [place] [phone]   [facility]/Dr. Osei-Bonsu  [address] [place] or [address], [place] [phone] Phone number for both [place] and [place] locations is the same.  Urgent Medical and [facility] [address], [place] [phone]   [facility] [address], [place] or [address] [phone]   [facility] [address] [place] [phone], phone; [phone], fax Sees patients 1st and 3rd Saturday of every month.  Must not qualify for public or private insurance (i.e. Medicaid, Medicare, [place] Health Choice, Veterans' Benefits)  Household income should be no more than 200% of the poverty level The clinic cannot treat you if you are  pregnant or think you are pregnant  Sexually transmitted diseases are not treated at the clinic.    Dental Care: Organization         Address  Phone  Notes  [facility] Department of [facility] [place] [phone] Accepts children up to age 59 who are enrolled in [place] or [place]; pregnant women with a Medicaid card; and children who have applied for Medicaid or [place] Health Choice, but were declined, whose parents can pay a reduced fee at time of service.  [facility] Department of [facility]  [address], [place] [phone] Accepts children up to age 49 who are enrolled in [place] or [place]; pregnant women with a Medicaid card; and children who have applied for Medicaid or [place] Health Choice, but were declined, whose parents can pay a reduced fee at time of service.  [place] [facility] PRO[facility]  [place] [phone] Patients are seen by appointment only. Walk-ins are not accepted. [place] will see patients 80 years of age and older. Monday - Tuesday (8am-5pm) Most Wednesdays (8:30-5pm) $30 per visit, cash only  [facility]  [address], [facility] [phone] Patients are seen by appointment only. Walk-ins are not accepted. [place] will see patients 55 years of age and older. One Wednesday Evening (Monthly: Volunteer Based).  $30 per visit, cash only  [place]  [phone] for adults; Children under age 37, call Graduate Pediatric Dentistry at [phone]. Children aged 24-14, please call [phone] to request a pediatric application.  Dental services are provided in all areas of dental care including fillings, crowns and bridges, complete and partial dentures, implants, gum treatment, root canals, and extractions. Preventive care is also provided. Treatment is provided to  both adults and children. Patients are selected via a lottery and there is often a waiting list.   [facility] [address] [place]  [phone] [email or web address]   [facility] [address] [place], [place] [phone], Ext. [phone] Second and Fourth Thursday of each month, opens at 6:30 AM; Clinic ends at 9 AM.  Patients are seen on a first-come first-served basis, and a limited number are seen during each clinic.   [facility]  [address] [place], [place] (  [phone]   Eligibility Requirements You must have lived in [place], [place], or [place] counties for at least the last three months.   You cannot be eligible for state or federal sponsored [employer], including [employer], [place], or [employer].   You generally cannot be eligible for healthcare insurance through your employer.    How to apply: Eligibility screenings are held every Tuesday and Wednesday afternoon from 1:00 pm until 4:00 pm. You do not need an appointment for the interview!  [facility] [address] [place], [place]   [place]  [place] Department  [place]  [phone]    Behavioral Health Resources in the Community: Intensive Outpatient Programs Organization         Address  Phone  Notes  [place]. [address], [place], [place] [phone]   [facility] Outpatient [address], [place], [place]   ADS: [facility] Drug [facility] [address] [place], [place]   [place] [address],  [place], [place] or [phone]   Substance Abuse Resources Organization         Address  Phone  Notes  Alcohol and Drug Services  [phone]   [place]  [phone]   [facility] [place]   Chinita Pester  [phone]   Residential & Outpatient Substance Abuse Program  [phone]   Psychological Services Organization         Address  Phone  Notes  [facility] [place]  [place]  [phone]   [place] [address] [place] or [phone]    Mobile Crisis Teams Organization         Address  Phone  Notes  [facility], Mobile Crisis Care Unit  [phone]   [facility]  [address] [place], [place]   [place] [address] [place] [phone]    Self-Help/Support Groups Organization         Address  Phone             Notes  [place]. of [place] - variety of support groups  [place] Call for more information  Narcotics Anonymous (NA), [facility] [address], [employer] [place]  2 meetings at this location   Special educational needs teacher         Address  Phone  Notes  [facility] [place],    [place]  [phone]   [facility]  [address], [place] [address], [place], [place]   [place], [place] [phone] Admissions: 8am-3pm M-F  Incentives Substance Story [address],    [place], [place] [phone]   [facility] [address] Jadene Pierini [place], [place]   [facility] [address],  [place], [place]   [facility] - Intensive Outpatient [address], [place], [place]   [facility] ([place].,  [place], [place] or [phone]   [facility] (RTS) [address], [place], [place] Accepts Medicaid  [place] [address]  [place] [phone] Substance Abuse/Addiction Treatment   [facility] Resources Organization  Address  Phone  Notes  °[facility]  ([phone]   °Julie Brannon, PhD [address] [place], [place]   ([phone] or ([phone]   °[facility]   [address] °[place], [place] ([phone]   °[facility] [address], [place], [employer] [phone] Insurance/Medicaid/sponsorship through [facility]  °[facility] [address]                                    [place], [place] [phone] Therapy/tele-psych/case  °[facility] [address]  ° [place], [place] [phone]    °Dr. Arfeen  ([phone]   °[facility] [place]  [employer] [place] [facility] 1) [address], [place] °2) [address], [place] °3)  [address], [place] ([phone]) [phone] °([phone]) [phone] ° °([phone]) [phone]   °[place] Child Abuse Hotline (336) 342-1394 or (336) [phone] (After Hours)    ° ° ° °

## 2014-01-13 NOTE — ED Notes (Signed)
Ortho called to appliy splint.

## 2014-01-13 NOTE — ED Notes (Addendum)
Pt reports having congestion, cold symptoms, productive cough, body aches and "has a knot" on his throat. Airway intact.

## 2014-01-13 NOTE — ED Notes (Signed)
Declined W/C at D/C and was escorted to lobby by RN. 

## 2014-01-13 NOTE — ED Provider Notes (Signed)
CSN: [id number]     Arrival date & time 01/13/14  1447 History  This chart was scribed for non-physician practitioner working with Leota Jacobsen, MD by Mercy Moore, ED Scribe. This patient was seen in room TR08C/TR08C and the patient's care was started at 5:06 PM.   Chief Complaint  Patient presents with  . URI      Patient is a 58 y.o. male presenting with URI.  URI Presenting symptoms: cough   Presenting symptoms: no fever   Associated symptoms: myalgias and wheezing    HPI Comments: Manuel Brady is a 58 y.o. male who presents to the Emergency Department complaining of wheezing and chest tightness ongoing for 3 days. Patient reports productive cough with white sputum, malaise, myalgia, and night sweating. Patient reports history of bronchitis and describes his current symptoms as similar. Patient denies SOB, difficulty breathing, unexplained weight loss, or decreased appetite. Denies recent tobacco use. Reports last smoking a cigarette over a year ago.   Patient also mentions a "knot" near his clavicle that has been present for years. States that his mother has similar characteristic. Patient denies pain or pain with applied pressure.   Past Medical History  Diagnosis Date  . Hepatitis C    Past Surgical History  Procedure Laterality Date  . Arm surgery    . Knee surgery     History reviewed. No pertinent family history. History  Substance Use Topics  . Smoking status: Former Research scientist (life sciences)  . Smokeless tobacco: Not on file  . Alcohol Use: No     Comment: former    Review of Systems  Constitutional: Negative for fever, appetite change and unexpected weight change.  Respiratory: Positive for cough, chest tightness and wheezing. Negative for shortness of breath.   Musculoskeletal: Positive for myalgias.      Allergies  Review of patient's allergies indicates no known allergies.  Home Medications   Prior to Admission medications   Medication Sig Start Date End Date  Taking? Authorizing Provider  cephALEXin (KEFLEX) 500 MG capsule Take 1 capsule (500 mg total) by mouth 4 (four) times daily. 12/30/13   Nicole Pisciotta, PA-C  ibuprofen (ADVIL,MOTRIN) 200 MG tablet Take 400 mg by mouth every 6 (six) hours as needed for mild pain.     Historical Provider, MD  sulfamethoxazole-trimethoprim (SEPTRA DS) 800-160 MG per tablet Take 1 tablet by mouth every 12 (twelve) hours. 12/30/13   Nicole Pisciotta, PA-C   Triage Vitals: BP 133/97  Pulse 82  Temp(Src) 98 F (36.7 C) (Oral)  Resp 18  Ht 6\' 2"  (1.88 m)  Wt 210 lb (95.255 kg)  BMI 26.95 kg/m2  SpO2 97% Physical Exam  Nursing note and vitals reviewed. Constitutional: He is oriented to person, place, and time. He appears well-developed and well-nourished. No distress.  HENT:  Head: Normocephalic and atraumatic.  Eyes: EOM are normal.  Neck: Neck supple. No tracheal deviation present.  Right clavicular head. Lot of prominence in bone. Feels enlarged copmared to the left clavicular head. No supraclavicual node enlargement. No cervical lymphadenopathy.   Cardiovascular: Normal rate.   Pulmonary/Chest: Effort normal and breath sounds normal. No respiratory distress.  Musculoskeletal: Normal range of motion.  Neurological: He is alert and oriented to person, place, and time.  Skin: Skin is warm and dry.  Psychiatric: He has a normal mood and affect. His behavior is normal.    ED Course  Procedures (including critical care time) DIAGNOSTIC STUDIES: Oxygen Saturation is 97% on room air, normal  by my interpretation.    COORDINATION OF CARE: 5:06 PM- Discussed treatment plan with patient at bedside and patient agreed to plan.     Labs Review Labs Reviewed - No data to display  Imaging Review No results found.   EKG Interpretation None      MDM   Final diagnoses:  URI (upper respiratory infection)    Pt CXR negative for acute infiltrate. Patients symptoms are consistent with URI, likely viral  etiology. Discussed that antibiotics are not indicated for viral infections. Pt will be discharged with symptomatic treatment.  Verbalizes understanding and is agreeable with plan. Pt is hemodynamically stable & in NAD prior to dc.  I personally performed the services described in this documentation, which was scribed in my presence. The recorded information has been reviewed and is accurate.  I personally reviewed the imaging tests through PACS system. I have reviewed and interpreted Lab values. I reviewed available ER/hospitalization records through the [place], PA-C 01/18/14 [place], PA-C 01/18/14 1808

## 2014-01-19 NOTE — ED Provider Notes (Signed)
Medical screening examination/treatment/procedure(s) were performed by non-physician practitioner and as supervising physician I was immediately available for consultation/collaboration.  Leota Jacobsen, MD 01/19/14 [phone]

## 2014-11-08 ENCOUNTER — Ambulatory Visit: Payer: Commercial Indemnity | Attending: Internal Medicine

## 2015-01-28 ENCOUNTER — Ambulatory Visit: Payer: Commercial Indemnity | Attending: Family Medicine | Admitting: Family Medicine

## 2015-01-28 ENCOUNTER — Encounter: Payer: Self-pay | Admitting: Family Medicine

## 2015-01-28 VITALS — BP 128/81 | HR 75 | Temp 98.0°F | Resp 15 | Ht 74.0 in | Wt 230.0 lb

## 2015-01-28 DIAGNOSIS — B351 Tinea unguium: Secondary | ICD-10-CM | POA: Insufficient documentation

## 2015-01-28 DIAGNOSIS — N400 Enlarged prostate without lower urinary tract symptoms: Secondary | ICD-10-CM | POA: Insufficient documentation

## 2015-01-28 DIAGNOSIS — B1920 Unspecified viral hepatitis C without hepatic coma: Secondary | ICD-10-CM | POA: Insufficient documentation

## 2015-01-28 DIAGNOSIS — J301 Allergic rhinitis due to pollen: Secondary | ICD-10-CM

## 2015-01-28 DIAGNOSIS — J309 Allergic rhinitis, unspecified: Secondary | ICD-10-CM | POA: Insufficient documentation

## 2015-01-28 DIAGNOSIS — Z114 Encounter for screening for human immunodeficiency virus [HIV]: Secondary | ICD-10-CM | POA: Insufficient documentation

## 2015-01-28 DIAGNOSIS — Z87891 Personal history of nicotine dependence: Secondary | ICD-10-CM | POA: Insufficient documentation

## 2015-01-28 DIAGNOSIS — Z0000 Encounter for general adult medical examination without abnormal findings: Secondary | ICD-10-CM

## 2015-01-28 DIAGNOSIS — B182 Chronic viral hepatitis C: Secondary | ICD-10-CM

## 2015-01-28 LAB — CBC
HEMATOCRIT: 42.5 % (ref 39.0–52.0)
HEMOGLOBIN: 13.7 g/dL (ref 13.0–17.0)
MCH: 28.3 pg (ref 26.0–34.0)
MCHC: 32.2 g/dL (ref 30.0–36.0)
MCV: 87.8 fL (ref 78.0–100.0)
MPV: 11.9 fL (ref 8.6–12.4)
PLATELETS: 217 10*3/uL (ref 150–400)
RBC: 4.84 MIL/uL (ref 4.22–5.81)
RDW: 12.8 % (ref 11.5–15.5)
WBC: 5.9 10*3/uL (ref 4.0–10.5)

## 2015-01-28 LAB — COMPLETE METABOLIC PANEL WITH GFR
ALT: 65 U/L — ABNORMAL HIGH (ref 0–53)
AST: 50 U/L — AB (ref 0–37)
Albumin: 3.9 g/dL (ref 3.5–5.2)
Alkaline Phosphatase: 74 U/L (ref 39–117)
BUN: 15 mg/dL (ref 6–23)
CALCIUM: 9.4 mg/dL (ref 8.4–10.5)
CO2: 27 meq/L (ref 19–32)
CREATININE: 0.85 mg/dL (ref 0.50–1.35)
Chloride: 102 mEq/L (ref 96–112)
GFR, Est African American: >89 mL/min
Glucose, Bld: 82 mg/dL (ref 70–99)
Potassium: 4.1 mEq/L (ref 3.5–5.3)
Sodium: 141 mEq/L (ref 135–145)
Total Bilirubin: 0.7 mg/dL (ref 0.2–1.2)
Total Protein: 7.4 g/dL (ref 6.0–8.3)

## 2015-01-28 LAB — LIPID PANEL
Cholesterol: 133 mg/dL (ref 0–200)
HDL: 57 mg/dL (ref >=40)
LDL Cholesterol: 57 mg/dL (ref 0–99)
Total CHOL/HDL Ratio: 2.3 Ratio
Triglycerides: 95 mg/dL (ref <150)
VLDL: 19 mg/dL (ref 0–40)

## 2015-01-28 LAB — POCT GLYCOSYLATED HEMOGLOBIN (HGB A1C): HEMOGLOBIN A1C: 5.1

## 2015-01-28 LAB — POC HEMOCCULT BLD/STL (OFFICE/1-CARD/DIAGNOSTIC): Card #1 Date: NEGATIVE

## 2015-01-28 LAB — GLUCOSE, POCT (MANUAL RESULT ENTRY): POC Glucose: 138 mg/dl — AB (ref 70–99)

## 2015-01-28 MED ORDER — CETIRIZINE HCL 10 MG PO TABS: 10.0000 mg | ORAL_TABLET | Freq: Every day | ORAL | Status: DC

## 2015-01-28 MED ORDER — IBUPROFEN 200 MG PO TABS: 400.0000 mg | ORAL_TABLET | Freq: Four times a day (QID) | ORAL | Status: DC | PRN

## 2015-01-28 NOTE — Assessment & Plan Note (Signed)
A: negative diabetes screen. Normal BP P: Screening HIV Lipids GI referral for screening colonoscopy

## 2015-01-28 NOTE — Progress Notes (Signed)
Establish Care Annual physical No Hx tobacco, No ETOH

## 2015-01-28 NOTE — Assessment & Plan Note (Signed)
A: soft, enlarged prostate on exam, patient denies bothersome nocturia  P: PSA

## 2015-01-28 NOTE — Assessment & Plan Note (Addendum)
A; hx of hep C with partial treatment. No evidence of liver disease on exam  P: Recheck Hep C, and viral load   Persistent hep C with high viral load ID referral for treatment

## 2015-01-28 NOTE — Progress Notes (Signed)
   Subjective:    Patient ID: Manuel Brady, male    DOB: 1956-05-21, 59 y.o.   MRN: [id number] CC: establish care, physical  HPI 59 yo M presents to establish care and for wellness physical  Complaints/concerns:  Cold symptoms x 4 days Hep C with hx of partial treatment in [facility] study   Soc Hx: former smoker, quit Med Hx: Hep C Surg Hx: L knee arthroscopy  Review of Systems General:  Negative for nexplained weight loss, fever Skin: Negative for new or changing mole, sore that won't heal HEENT: Positive for scratchy throat and L ear soreness. Negative for trouble hearing, trouble seeing, ringing in ears, mouth sores, hoarseness, change in voice, dysphagia. CV:  Negative for chest pain, dyspnea, edema, palpitations Resp: Negative for cough, dyspnea, hemoptysis GI: positive for gas. Negative for nausea, vomiting, diarrhea, constipation, abdominal pain, melena, hematochezia. GU: positive for nocturia 1-3 times per night. Negative for dysuria, incontinence, urinary hesitance, hematuria, vaginal or penile discharge, polyuria, sexual difficulty, lumps in testicle or breasts.  MSK: Negative for muscle cramps or aches, joint pain or swelling Neuro: Negative for headaches, weakness, numbness, dizziness, passing out/fainting Psych: Negative for depression, anxiety, memory problems     Objective:   Physical Exam BP 128/81 mmHg  Pulse 75  Temp(Src) 98 F (36.7 C) (Oral)  Resp 15  Ht 6\' 2"  (1.88 m)  Wt 230 lb (104.327 kg)  BMI 29.52 kg/m2  SpO2 97%  General Appearance:    Alert, cooperative, no distress, appears stated age  Head:    Normocephalic, without obvious abnormality, atraumatic  Eyes:    PERRL, conjunctiva/corneas clear, EOM's intact, fundi    benign, both eyes       Ears:    Normal TM's and external ear canals, both ears  Nose:   Nares normal, septum midline, mucosa normal, no drainage   or sinus tenderness  Throat:   Lips, mucosa, and tongue normal; teeth and gums normal    Neck:   Supple, symmetrical, trachea midline, no adenopathy;       thyroid:  No enlargement/tenderness/nodules; no carotid   bruit or JVD  Back:     Symmetric, no curvature, ROM normal, no CVA tenderness  Lungs:     Clear to auscultation bilaterally, respirations unlabored  Chest wall:    No tenderness or deformity  Heart:    Regular rate and rhythm, S1 and S2 normal, no murmur, rub   or gallop  Abdomen:     Soft, non-tender, bowel sounds active all four quadrants,    no masses, no organomegaly  Genitalia:    Normal male without lesion, discharge or tenderness  Rectal:    Normal tone, prostate slightly enlarged, no masses or tenderness;   guaiac negative stool  Extremities:   Extremities normal, atraumatic, no cyanosis or edema  Pulses:   2+ and symmetric all extremities  Skin:   Skin color, texture, turgor normal, no rashes or lesions, toenails yellow and long   Lymph nodes:   Cervical, supraclavicular, and axillary nodes normal  Neurologic:   CNII-XII intact. Normal strength, sensation and reflexes      throughout   Lab Results  Component Value Date   HGBA1C 5.10 01/28/2015   CBG 138       Assessment & Plan:

## 2015-01-28 NOTE — Assessment & Plan Note (Signed)
A; onychomycosis of toenails P:  CMP Hep C Consider lamisil

## 2015-01-28 NOTE — Patient Instructions (Signed)
Manuel Brady,  Thank you for coming in today. It was a pleasure meeting you. I look forward to being your primary doctor.  1. Normal BP, blood sugar done today 2. Cold symptoms: appears to be allergies, start daily zyrtec 3. Hep C: checking viral load will refer to ID clinic for treatment if high 4. Referral to GI for screening colonoscopy made  You will be called with lab results   F/u in 6 months  Dr. Adrian Blackwater

## 2015-01-28 NOTE — Assessment & Plan Note (Addendum)
Screening HIV ordered today

## 2015-01-28 NOTE — Assessment & Plan Note (Signed)
A: allergic rhinitis cause of cold symptoms P: Zyrtec

## 2015-01-29 LAB — HEPATITIS C ANTIBODY: HCV AB: REACTIVE — AB

## 2015-01-29 LAB — HEPATITIS C RNA QUANTITATIVE
HCV QUANT LOG: 6.13 {Log} — AB (ref <1.18)
HCV QUANT: 1348347 [IU]/mL — AB (ref <15)

## 2015-01-29 LAB — PSA: PSA: 1.18 ng/mL (ref <=4.00)

## 2015-01-29 LAB — HIV ANTIBODY (ROUTINE TESTING W REFLEX): HIV: NONREACTIVE

## 2015-01-31 NOTE — Addendum Note (Signed)
Addended by: Boykin Nearing on: 01/31/2015 12:03 PM   Modules accepted: Orders

## 2015-02-04 LAB — HEPATITIS C RNA QUANTITATIVE

## 2015-02-05 ENCOUNTER — Telehealth: Payer: Self-pay | Admitting: *Deleted

## 2015-02-05 NOTE — Telephone Encounter (Signed)
-----   Message from Boykin Nearing, MD sent at 01/31/2015 12:02 PM EDT ----- Persistent hep C with high viral load Referral to ID clinic placed to discuss treatment

## 2015-02-05 NOTE — Telephone Encounter (Signed)
Pt came in for lab results Pt sign medical disclosure  Results and copy given to pt

## 2015-02-14 ENCOUNTER — Telehealth: Payer: Self-pay

## 2015-02-14 NOTE — Telephone Encounter (Signed)
Pt had received a letter to call to be triaged. Patient hung up or lost signal on his phone before I could get back to him, but DS was leaving for the day about the time patient had called. Please call patient [phone]

## 2015-02-15 ENCOUNTER — Other Ambulatory Visit: Payer: Self-pay

## 2015-02-15 DIAGNOSIS — Z1211 Encounter for screening for malignant neoplasm of colon: Secondary | ICD-10-CM

## 2015-02-15 NOTE — Telephone Encounter (Signed)
I triaged pt. He is checking on who he can get to go with him to the hospital, he will have to get transportation and a friend to accompany him.  He will try to call me back next week.

## 2015-02-19 NOTE — Telephone Encounter (Signed)
Gastroenterology Pre-Procedure Review  Request Date: 02/14/2015 Requesting Physician: Dr. Boykin Nearing  PATIENT REVIEW QUESTIONS: The patient responded to the following health history questions as indicated:    1. Diabetes Melitis: no 2. Joint replacements in the past 12 months: no 3. Major health problems in the past 3 months: no 4. Has an artificial valve or MVP: no 5. Has a defibrillator: no 6. Has been advised in past to take antibiotics in advance of a procedure like teeth cleaning: no    MEDICATIONS & ALLERGIES:    Patient reports the following regarding taking any blood thinners:   Plavix? no Aspirin? no Coumadin? no  Patient confirms/reports the following medications:  Current Outpatient Prescriptions  Medication Sig Dispense Refill  . cetirizine (ZYRTEC) 10 MG tablet Take 1 tablet (10 mg total) by mouth daily. 30 tablet 11  . ibuprofen (ADVIL,MOTRIN) 200 MG tablet Take 2 tablets (400 mg total) by mouth every 6 (six) hours as needed for mild pain. 30 tablet    No current facility-administered medications for this visit.    Patient confirms/reports the following allergies:  No Known Allergies  No orders of the defined types were placed in this encounter.    AUTHORIZATION INFORMATION Primary Insurance:   ID #:  Group #:  Pre-Cert / Auth required: Pre-Cert / Auth #:   Secondary Insurance:   ID #:   Group #:  Pre-Cert / Auth required:  Pre-Cert / Auth #:   SCHEDULE INFORMATION: Procedure has been scheduled as follows:  Date:03/05/2015             Time: 11:15 AM Location: [facility]  This Gastroenterology Pre-Precedure Review Form is being routed to the following provider(s): Barney Drain, MD

## 2015-02-19 NOTE — Telephone Encounter (Signed)
PREPOPIK-DRINK WATER TO KEEP URINE LIGHT YELLOW. FULL LIQUIDS WITH BREAKFAST.  PT SHOULD DROP OFF RX 3 DAYS PRIOR TO PROCEDURE.   Full Liquid Diet A high-calorie, high-protein supplement should be used to meet your nutritional requirements when the full liquid diet is continued for more than 2 or 3 days. If this diet is to be used for an extended period of time (more than 7 days), a multivitamin should be considered.  Breads and Starches  Allowed: None are allowed except crackers WHOLE OR pureed (made into a thick, smooth soup) in soup.   Avoid: Any others.    Potatoes/Pasta/Rice  Allowed: ANY ITEM AS A SOUP OR SMALL PLATE OF MASHED POTATOES.       Vegetables  Allowed: Strained tomato or vegetable juice. Vegetables pureed in soup.   Avoid: Any others.    Fruit  Allowed: Any strained fruit juices and fruit drinks. Include 1 serving of citrus or vitamin C-enriched fruit juice daily.   Avoid: Any others.  Meat and Meat Substitutes  Allowed: Egg  Avoid: Any meat, fish, or fowl. All cheese.  Milk  Allowed: Milk beverages, including milk shakes and instant breakfast mixes. Smooth yogurt.   Avoid: Any others. Avoid dairy products if not tolerated.    Soups and Combination Foods  Allowed: Broth, strained cream soups. Strained, broth-based soups.   Avoid: Any others.    Desserts and Sweets  Allowed: flavored gelatin,plain ice cream, sherbet, smooth pudding, junket, fruit ices, frozen ice pops, pudding pops,, frozen fudge pops, chocolate syrup. Sugar, honey, jelly, syrup.   Avoid: Any others.  Fats and Oils  Allowed: Margarine, butter, cream, sour cream, oils.   Avoid: Any others.  Beverages  Allowed: All.   Avoid: None.  Condiments  Allowed: Iodized salt, pepper, spices, flavorings. Cocoa powder.   Avoid: Any others.    SAMPLE MEAL PLAN Breakfast   cup orange juice.   1 OR 2 EGGS   1 cup  milk.   1 cup beverage (coffee or tea).   Cream or  sugar, if desired.    Midmorning Snack  2 SCRAMBLED OR HARD BOILED EGG   Lunch  1 cup cream soup.    cup fruit juice.   1 cup milk.    cup custard.   1 cup beverage (coffee or tea).   Cream or sugar, if desired.    Midafternoon Snack  1 cup milk shake.  Dinner  1 cup cream soup.    cup fruit juice.   1 cup milk.    cup pudding.   1 cup beverage (coffee or tea).   Cream or sugar, if desired.  Evening Snack  1 cup supplement.  To increase calories, add sugar, cream, butter, or margarine if possible. Nutritional supplements will also increase the total calories.

## 2015-02-20 MED ORDER — SOD PICOSULFATE-MAG OX-CIT ACD 10-3.5-12 MG-GM-GM PO PACK: 1.0000 | PACK | ORAL | Status: DC

## 2015-02-20 NOTE — Telephone Encounter (Signed)
Rx sent to the pharmacy and instructions mailed to pt.  

## 2015-02-20 NOTE — Addendum Note (Signed)
Addended by: Everardo All on: 02/20/2015 10:27 AM   Modules accepted: Orders

## 2015-02-21 ENCOUNTER — Telehealth: Payer: Self-pay

## 2015-02-21 MED ORDER — PEG 3350-KCL-NA BICARB-NACL 420 G PO SOLR: 4000.0000 mL | ORAL | Status: DC

## 2015-02-21 NOTE — Telephone Encounter (Signed)
PT called and asked for cheaper prep. Trilyte sent in. New instructions mailed.

## 2015-02-21 NOTE — OR Nursing (Signed)
Received a call from Turkey at [facility] and [place] who stated that patient could not afford the prep that was sent to [facility] and needed another prescription for [facility]. Patient will be able to purchase Golytley for $10.00. Notified Ginger at Dr. Oneida Alar office who is to have Doris send a prescription to [facility]. Called Ariana back and told her to expect a prescription for [facility].

## 2015-03-05 ENCOUNTER — Ambulatory Visit (HOSPITAL_COMMUNITY)
Admission: RE | Admit: 2015-03-05 | Discharge: 2015-03-05 | Disposition: A | Discharge status: Home / Self Care | Payer: Self-pay | Source: Ambulatory Visit | Attending: Gastroenterology | Admitting: Gastroenterology

## 2015-03-05 ENCOUNTER — Encounter (HOSPITAL_COMMUNITY)
Admission: RE | Disposition: A | Discharge status: Home / Self Care | Payer: Self-pay | Source: Ambulatory Visit | Attending: Gastroenterology

## 2015-03-05 ENCOUNTER — Encounter (HOSPITAL_COMMUNITY): Payer: Self-pay | Admitting: *Deleted

## 2015-03-05 DIAGNOSIS — D125 Benign neoplasm of sigmoid colon: Secondary | ICD-10-CM | POA: Insufficient documentation

## 2015-03-05 DIAGNOSIS — Z79899 Other long term (current) drug therapy: Secondary | ICD-10-CM | POA: Insufficient documentation

## 2015-03-05 DIAGNOSIS — M13851 Other specified arthritis, right hip: Secondary | ICD-10-CM | POA: Insufficient documentation

## 2015-03-05 DIAGNOSIS — B1920 Unspecified viral hepatitis C without hepatic coma: Secondary | ICD-10-CM | POA: Insufficient documentation

## 2015-03-05 DIAGNOSIS — K648 Other hemorrhoids: Secondary | ICD-10-CM | POA: Insufficient documentation

## 2015-03-05 DIAGNOSIS — Z87891 Personal history of nicotine dependence: Secondary | ICD-10-CM | POA: Insufficient documentation

## 2015-03-05 DIAGNOSIS — Z1211 Encounter for screening for malignant neoplasm of colon: Secondary | ICD-10-CM | POA: Insufficient documentation

## 2015-03-05 HISTORY — PX: COLONOSCOPY: SHX5424

## 2015-03-05 SURGERY — COLONOSCOPY
Anesthesia: Moderate Sedation

## 2015-03-05 MED ORDER — MIDAZOLAM HCL 5 MG/5ML IJ SOLN
INTRAMUSCULAR | Status: DC | PRN
  Administered 2015-03-05 (×2): 2 mg via INTRAVENOUS

## 2015-03-05 MED ORDER — SODIUM CHLORIDE 0.9 % IV SOLN
INTRAVENOUS | Status: DC
  Administered 2015-03-05: 11:00:00 via INTRAVENOUS

## 2015-03-05 MED ORDER — MEPERIDINE HCL 100 MG/ML IJ SOLN
INTRAMUSCULAR | Status: DC | PRN
  Administered 2015-03-05 (×2): 25 mg via INTRAVENOUS

## 2015-03-05 MED ORDER — MIDAZOLAM HCL 5 MG/5ML IJ SOLN
INTRAMUSCULAR | Status: AC
  Filled 2015-03-05: qty 10

## 2015-03-05 MED ORDER — MEPERIDINE HCL 100 MG/ML IJ SOLN
INTRAMUSCULAR | Status: AC
  Filled 2015-03-05: qty 2

## 2015-03-05 NOTE — Discharge Instructions (Signed)
You had 1 polyp removed. You have moderate size internal hemorrhoids.   FOLLOW A HIGH FIBER DIET. AVOID ITEMS THAT CAUSE BLOATING & GAS. SEE INFO BELOW.  YOUR BIOPSY RESULTS WILL BE AVAILABLE IN MY CHART AFTER JUL 28 AND MY OFFICE WILL CONTACT YOU IN 10-14 DAYS WITH YOUR RESULTS.   Next colonoscopy in 5-10 years.    Colonoscopy Care After Read the instructions outlined below and refer to this sheet in the next week. These discharge instructions provide you with general information on caring for yourself after you leave the hospital. While your treatment has been planned according to the most current medical practices available, unavoidable complications occasionally occur. If you have any problems or questions after discharge, call DR. Litha Lamartina, [phone].  ACTIVITY  You may resume your regular activity, but move at a slower pace for the next 24 hours.   Take frequent rest periods for the next 24 hours.   Walking will help get rid of the air and reduce the bloated feeling in your belly (abdomen).   No driving for 24 hours (because of the medicine (anesthesia) used during the test).   You may shower.   Do not sign any important legal documents or operate any machinery for 24 hours (because of the anesthesia used during the test).    NUTRITION  Drink plenty of fluids.   You may resume your normal diet as instructed by your doctor.   Begin with a light meal and progress to your normal diet. Heavy or fried foods are harder to digest and may make you feel sick to your stomach (nauseated).   Avoid alcoholic beverages for 24 hours or as instructed.    MEDICATIONS  You may resume your normal medications.   WHAT YOU CAN EXPECT TODAY  Some feelings of bloating in the abdomen.   Passage of more gas than usual.   Spotting of blood in your stool or on the toilet paper  .  IF YOU HAD POLYPS REMOVED DURING THE COLONOSCOPY:  Eat a soft diet IF YOU HAVE NAUSEA, BLOATING,  ABDOMINAL PAIN, OR VOMITING.    FINDING OUT THE RESULTS OF YOUR TEST Not all test results are available during your visit. DR. Oneida Alar WILL CALL YOU WITHIN 14 DAYS OF YOUR PROCEDUE WITH YOUR RESULTS. Do not assume everything is normal if you have not heard from DR. Jacquelene Kopecky. CALL HER OFFICE AT [phone].  SEEK IMMEDIATE MEDICAL ATTENTION AND CALL THE OFFICE: [phone] IF:  You have more than a spotting of blood in your stool.   Your belly is swollen (abdominal distention).   You are nauseated or vomiting.   You have a temperature over 101F.   You have abdominal pain or discomfort that is severe or gets worse throughout the day.   High-Fiber Diet A high-fiber diet changes your normal diet to include more whole grains, legumes, fruits, and vegetables. Changes in the diet involve replacing refined carbohydrates with unrefined foods. The calorie level of the diet is essentially unchanged. The Dietary Reference Intake (recommended amount) for adult males is 38 grams per day. For adult females, it is 25 grams per day. Pregnant and lactating women should consume 28 grams of fiber per day. Fiber is the intact part of a plant that is not broken down during digestion. Functional fiber is fiber that has been isolated from the plant to provide a beneficial effect in the body. PURPOSE  Increase stool bulk.   Ease and regulate bowel movements.   Lower cholesterol.  REDUCE RISK OF COLON CANCER  INDICATIONS THAT YOU NEED MORE FIBER  Constipation and hemorrhoids.   Uncomplicated diverticulosis (intestine condition) and irritable bowel syndrome.   Weight management.   As a protective measure against hardening of the arteries (atherosclerosis), diabetes, and cancer.   GUIDELINES FOR INCREASING FIBER IN THE DIET  Start adding fiber to the diet slowly. A gradual increase of about 5 more grams (2 slices of whole-wheat bread, 2 servings of most fruits or vegetables, or 1 bowl of high-fiber  cereal) per day is best. Too rapid an increase in fiber may result in constipation, flatulence, and bloating.   Drink enough water and fluids to keep your urine clear or pale yellow. Water, juice, or caffeine-free drinks are recommended. Not drinking enough fluid may cause constipation.   Eat a variety of high-fiber foods rather than one type of fiber.   Try to increase your intake of fiber through using high-fiber foods rather than fiber pills or supplements that contain small amounts of fiber.   The goal is to change the types of food eaten. Do not supplement your present diet with high-fiber foods, but replace foods in your present diet.   INCLUDE A VARIETY OF FIBER SOURCES  Replace refined and processed grains with whole grains, canned fruits with fresh fruits, and incorporate other fiber sources. White rice, white breads, and most bakery goods contain little or no fiber.   Brown whole-grain rice, buckwheat oats, and many fruits and vegetables are all good sources of fiber. These include: broccoli, Brussels sprouts, cabbage, cauliflower, beets, sweet potatoes, white potatoes (skin on), carrots, tomatoes, eggplant, squash, berries, fresh fruits, and dried fruits.   Cereals appear to be the richest source of fiber. Cereal fiber is found in whole grains and bran. Bran is the fiber-rich outer coat of cereal grain, which is largely removed in refining. In whole-grain cereals, the bran remains. In breakfast cereals, the largest amount of fiber is found in those with "bran" in their names. The fiber content is sometimes indicated on the label.   You may need to include additional fruits and vegetables each day.   In baking, for 1 cup white flour, you may use the following substitutions:   1 cup whole-wheat flour minus 2 tablespoons.   1/2 cup white flour plus 1/2 cup whole-wheat flour.   Polyps, Colon  A polyp is extra tissue that grows inside your body. Colon polyps grow in the large  intestine. The large intestine, also called the colon, is part of your digestive system. It is a long, hollow tube at the end of your digestive tract where your body makes and stores stool. Most polyps are not dangerous. They are benign. This means they are not cancerous. But over time, some types of polyps can turn into cancer. Polyps that are smaller than a pea are usually not harmful. But larger polyps could someday become or may already be cancerous. To be safe, doctors remove all polyps and test them.    PREVENTION There is not one sure way to prevent polyps. You might be able to lower your risk of getting them if you:  Eat more fruits and vegetables and less fatty food.   Do not smoke.   Avoid alcohol.   Exercise every day.   Lose weight if you are overweight.   Eating more calcium and folate can also lower your risk of getting polyps. Some foods that are rich in calcium are milk, cheese, and broccoli. Some foods that  are rich in folate are chickpeas, kidney beans, and spinach.   Hemorrhoids Hemorrhoids are dilated (enlarged) veins around the rectum. Sometimes clots will form in the veins. This makes them swollen and painful. These are called thrombosed hemorrhoids. Causes of hemorrhoids include:  Constipation.   Straining to have a bowel movement.   HEAVY LIFTING HOME CARE INSTRUCTIONS  Eat a well balanced diet and drink 6 to 8 glasses of water every day to avoid constipation. You may also use a bulk laxative.   Avoid straining to have bowel movements.   Keep anal area dry and clean.   Do not use a donut shaped pillow or sit on the toilet for long periods. This increases blood pooling and pain.   Move your bowels when your body has the urge; this will require less straining and will decrease pain and pressure.

## 2015-03-05 NOTE — Op Note (Addendum)
[facility] [address] [place], [address]   COLONOSCOPY PROCEDURE REPORT  PATIENT: Manuel Brady, Manuel Brady  MR#: [id number] BIRTHDATE: [birth year] , 31  yrs. old GENDER: male ENDOSCOPIST: Danie Binder, MD REFERRED LN:[id number] Funches, MD PROCEDURE DATE:  2015/03/22 PROCEDURE:   Colonoscopy with cold biopsy polypectomy INDICATIONS:average risk patient for colon cancer. MEDICATIONS: Demerol 50 mg IV and Versed 4 mg IV  DESCRIPTION OF PROCEDURE:    Physical exam was performed.  Informed consent was obtained from the patient after explaining the benefits, risks, and alternatives to procedure.  The patient was connected to monitor and placed in left lateral position. Continuous oxygen was provided by nasal cannula and IV medicine administered through an indwelling cannula.  After administration of sedation and rectal exam, the patients rectum was intubated and the EC-[id number] ([id number])  colonoscope was advanced under direct visualization to the cecum.  The scope was removed slowly by carefully examining the color, texture, anatomy, and integrity mucosa on the way out.  The patient was recovered in endoscopy and discharged home in satisfactory condition. Estimated blood loss is zero unless otherwise noted in this procedure report.     COLON FINDINGS: A sessile polyp measuring 3 mm in size was found in the sigmoid colon.  A polypectomy was performed with cold forceps. , The colon was redundant.  , and Moderate sized internal hemorrhoids were found.  PREP QUALITY: good. CECAL W/D TIME: 10       minutes COMPLICATIONS: None  ENDOSCOPIC IMPRESSION: 1.   ONE COLON POLYP REMOVED 2.   The LEFT colon IS SLIGHTLY redundant 3.   Moderate sized internal hemorrhoids  RECOMMENDATIONS: AWAIT BIOPSY HIGH FIBER DIET NEXT TCS IN 5-10 YEARS     _______________________________ Lorrin MaisDanie Binder, MD 03-22-2015 12:04 PM Revised: 03/22/15 12:04 PM   CPT CODES: ICD  CODES:  The ICD and CPT codes recommended by this software are interpretations from the data that the clinical staff has captured with the software.  The verification of the translation of this report to the ICD and CPT codes and modifiers is the sole responsibility of the health care institution and practicing physician where this report was generated.  [employer] will not be held responsible for the validity of the ICD and CPT codes included on this report.  AMA assumes no liability for data contained or not contained herein. CPT is a Designer, television/film set of the [employer].

## 2015-03-05 NOTE — H&P (Signed)
  Primary Care Physician:  Minerva Ends, MD Primary Gastroenterologist:  Dr. Oneida Alar  Pre-Procedure History & Physical: HPI:  Manuel Brady is a 59 y.o. male here for COLON CANCER SCREENING.  Past Medical History  Diagnosis Date  . Hepatitis C DX 2005    partial [facility] study  . Arthritis     R hip     Past Surgical History  Procedure Laterality Date  . Arm surgery Left 2003     plates in arm, arm caught in machine, crush injury   . Knee surgery Left 2005     Prior to Admission medications   Medication Sig Start Date End Date Taking? Authorizing Provider  cetirizine (ZYRTEC) 10 MG tablet Take 1 tablet (10 mg total) by mouth daily. 01/28/15  Yes Josalyn Funches, MD  ibuprofen (ADVIL,MOTRIN) 200 MG tablet Take 2 tablets (400 mg total) by mouth every 6 (six) hours as needed for mild pain. 01/28/15  Yes Josalyn Funches, MD  polyethylene glycol-electrolytes (TRILYTE) 420 G solution Take 4,000 mLs by mouth as directed. 02/21/15   Danie Binder, MD  Sod Picosulfate-Mag Ox-Cit Acd 10-3.5-12 MG-GM-GM PACK Take 1 Container by mouth as directed. 02/20/15   Danie Binder, MD    Allergies as of 02/15/2015  . (No Known Allergies)    Family History  Problem Relation Age of Onset  . Hypertension Mother   . Cancer Father     lung    History   Social History  . Marital Status: Single    Spouse Name: N/A  . Number of Children: 4  . Years of Education: some colle   Occupational History  . [employer]    Social History Main Topics  . Smoking status: Former Smoker    Types: Cigarettes    Quit date: 01/08/2013  . Smokeless tobacco: Never Used  . Alcohol Use: No     Comment: former  . Drug Use: No     Comment: Last use two years ago (2014)  . Sexual Activity: Not Currently    Birth Control/ Protection: None   Other Topics Concern  . Not on file   Social History Narrative   Lives in a room boarding house     Review of Systems: See HPI,  otherwise negative ROS   Physical Exam: There were no vitals taken for this visit. General:   Alert,  pleasant and cooperative in NAD Head:  Normocephalic and atraumatic. Neck:  Supple; Lungs:  Clear throughout to auscultation.    Heart:  Regular rate and rhythm. Abdomen:  Soft, nontender and nondistended. Normal bowel sounds, without guarding, and without rebound.   Neurologic:  Alert and  oriented x4;  grossly normal neurologically.  Impression/Plan:     SCREENING  Plan:  1. TCS TODAY

## 2015-03-06 ENCOUNTER — Encounter (HOSPITAL_COMMUNITY): Payer: Self-pay | Admitting: Gastroenterology

## 2015-03-06 ENCOUNTER — Telehealth: Payer: Self-pay | Admitting: Gastroenterology

## 2015-03-06 NOTE — Telephone Encounter (Signed)
Please call pt. He had A HYPERPLASTIC POLYPS removed from HIS colon. FOLLOW A High fiber diet. TCS in 10 years.

## 2015-03-06 NOTE — Telephone Encounter (Signed)
LMOM to call back

## 2015-03-07 ENCOUNTER — Other Ambulatory Visit: Payer: Self-pay

## 2015-03-07 DIAGNOSIS — B182 Chronic viral hepatitis C: Secondary | ICD-10-CM

## 2015-03-07 LAB — IRON: IRON: 141 ug/dL (ref 42–165)

## 2015-03-07 MED ORDER — HYDROCORTISONE 2.5 % RE CREA: 1.0000 "application " | TOPICAL_CREAM | Freq: Four times a day (QID) | RECTAL | Status: DC

## 2015-03-07 NOTE — Telephone Encounter (Signed)
Placed on recall list.

## 2015-03-07 NOTE — Telephone Encounter (Signed)
Pt is calling to see if he can get some medication for his hemorrhoids. Please advise

## 2015-03-07 NOTE — Addendum Note (Signed)
Addended by: Danie Binder on: 03/07/2015 04:57 PM   Modules accepted: Orders, Medications

## 2015-03-07 NOTE — Telephone Encounter (Signed)
Spoke with pt and he is aware of results. 

## 2015-03-07 NOTE — Telephone Encounter (Signed)
PLEASE CALL PT.  USE ANUSOL FOUR TIMES DAILY FOR 10 DAYS. CALL IN 4-6 WEEKS IF SYMPTOMS ARE NOT IMPROVED.

## 2015-03-08 LAB — HEPATITIS B SURFACE ANTIGEN: Hepatitis B Surface Ag: NEGATIVE

## 2015-03-08 LAB — HEPATITIS B CORE ANTIBODY, TOTAL: Hep B Core Total Ab: NONREACTIVE

## 2015-03-08 LAB — HEPATITIS A ANTIBODY, TOTAL: Hep A Total Ab: BORDERLINE — AB

## 2015-03-08 LAB — PROTIME-INR
INR: 1.11 (ref <1.50)
Prothrombin Time: 14.3 seconds (ref 11.6–15.2)

## 2015-03-08 LAB — ANA: ANA: NEGATIVE

## 2015-03-08 LAB — HEPATITIS B SURFACE ANTIBODY,QUALITATIVE: HEP B S AB: NEGATIVE

## 2015-03-08 NOTE — Telephone Encounter (Signed)
Pt is aware.  

## 2015-03-12 ENCOUNTER — Emergency Department (INDEPENDENT_AMBULATORY_CARE_PROVIDER_SITE_OTHER)
Admission: EM | Admit: 2015-03-12 | Discharge: 2015-03-12 | Disposition: A | Discharge status: Home / Self Care | Payer: No Typology Code available for payment source | Source: Home / Self Care | Attending: Family Medicine | Admitting: Family Medicine

## 2015-03-12 ENCOUNTER — Encounter (HOSPITAL_COMMUNITY): Payer: Self-pay | Admitting: *Deleted

## 2015-03-12 DIAGNOSIS — L308 Other specified dermatitis: Principal | ICD-10-CM

## 2015-03-12 DIAGNOSIS — B028 Zoster with other complications: Secondary | ICD-10-CM

## 2015-03-12 MED ORDER — VALACYCLOVIR HCL 1 G PO TABS: 1000.0000 mg | ORAL_TABLET | Freq: Three times a day (TID) | ORAL | Status: DC

## 2015-03-12 NOTE — ED Provider Notes (Signed)
CSN: [id number]     Arrival date & time 03/12/15  1657 History   First MD Initiated Contact with Patient 03/12/15 1712     Chief Complaint  Patient presents with  . Rash   (Consider location/radiation/quality/duration/timing/severity/associated sxs/prior Treatment) Patient is a 59 y.o. male presenting with rash. The history is provided by the patient.  Rash Location:  Torso Torso rash location:  Abd RLQ Quality: blistering, painful and redness   Pain details:    Quality:  Burning   Severity:  Moderate   Onset quality:  Gradual   Duration:  2 days Progression:  Spreading Chronicity:  New Relieved by:  None tried Worsened by:  Nothing tried Ineffective treatments:  None tried Associated symptoms: no fever     Past Medical History  Diagnosis Date  . Hepatitis C DX 2005    partial [facility] study  . Arthritis     R hip    Past Surgical History  Procedure Laterality Date  . Arm surgery Left 2003     plates in arm, arm caught in machine, crush injury   . Knee surgery Left 2005   . Colonoscopy N/A 03/05/2015    Procedure: COLONOSCOPY;  Surgeon: Danie Binder, MD;  Location: [facility];  Service: Endoscopy;  Laterality: N/A;  11:15 AM   Family History  Problem Relation Age of Onset  . Hypertension Mother   . Cancer Father     lung  . Colon cancer Neg Hx    History  Substance Use Topics  . Smoking status: Former Smoker    Types: Cigarettes    Quit date: 01/08/2013  . Smokeless tobacco: Never Used  . Alcohol Use: No     Comment: former    Review of Systems  Constitutional: Negative.  Negative for fever.  Gastrointestinal: Negative.   Skin: Positive for rash.    Allergies  Review of patient's allergies indicates no known allergies.  Home Medications   Prior to Admission medications   Medication Sig Start Date End Date Taking? Authorizing Provider  cetirizine (ZYRTEC) 10 MG tablet Take 1 tablet (10 mg total) by mouth daily. 01/28/15   Josalyn Funches, MD    hydrocortisone (ANUSOL-HC) 2.5 % rectal cream Place 1 application rectally 2 (two) times daily.    Historical Provider, MD  hydrocortisone (ANUSOL-HC) 2.5 % rectal cream Place 1 application rectally 4 (four) times daily. FOR 10 DAYS TO TREAT RECTAL PAIN OR BLEEDING 03/07/15   Danie Binder, MD  ibuprofen (ADVIL,MOTRIN) 200 MG tablet Take 2 tablets (400 mg total) by mouth every 6 (six) hours as needed for mild pain. 01/28/15   Josalyn Funches, MD  valACYclovir (VALTREX) 1000 MG tablet Take 1 tablet (1,000 mg total) by mouth 3 (three) times daily. 03/12/15   Billy Fischer, MD   BP 123/82 mmHg  Pulse 75  Temp(Src) 97.9 F (36.6 C) (Oral)  Resp 12  SpO2 96% Physical Exam  Constitutional: He is oriented to person, place, and time. He appears well-developed and well-nourished. He appears distressed.  Abdominal: Soft. Bowel sounds are normal. There is no tenderness.  Neurological: He is alert and oriented to person, place, and time.  Skin: Skin is warm and dry. Rash noted. Rash is vesicular. There is erythema.     Nursing note and vitals reviewed.   ED Course  Procedures (including critical care time) Labs Review Labs Reviewed - No data to display  Imaging Review No results found.   MDM   1.  Herpes zoster dermatitis        Billy Fischer, MD 03/12/15 [phone]

## 2015-03-12 NOTE — ED Notes (Signed)
Pt    Reports    Symptoms  Of  Rash   Vesicular    In  Nature          Pt    denys  Any       Any  No  New  meds                Or  Any    Known  Causative  Agents

## 2015-03-14 LAB — HEPATITIS C GENOTYPE

## 2015-04-03 ENCOUNTER — Ambulatory Visit (INDEPENDENT_AMBULATORY_CARE_PROVIDER_SITE_OTHER): Payer: No Typology Code available for payment source | Admitting: Internal Medicine

## 2015-04-03 ENCOUNTER — Encounter: Payer: Self-pay | Admitting: Internal Medicine

## 2015-04-03 VITALS — BP 123/80 | HR 79 | Temp 97.7°F | Ht 74.0 in | Wt 227.0 lb

## 2015-04-03 DIAGNOSIS — Z23 Encounter for immunization: Secondary | ICD-10-CM

## 2015-04-03 DIAGNOSIS — B182 Chronic viral hepatitis C: Secondary | ICD-10-CM

## 2015-04-03 MED ORDER — ELBASVIR-GRAZOPREVIR 50-100 MG PO TABS: 1.0000 | ORAL_TABLET | Freq: Every day | ORAL | Status: DC

## 2015-04-03 NOTE — Addendum Note (Signed)
Addended by: Myrtis Hopping A on: 04/03/2015 03:26 PM   Modules accepted: Orders

## 2015-04-03 NOTE — Progress Notes (Signed)
HPI:  +Manuel Brady is a 59 y.o. male who presents for initial evaluation and management of chronic hepatitis C.  Patient tested positive 10 years ago. 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 Patient denies renal dialysis, sexual contact with person with liver disease, tattoos. Patient has had other studies performed. Results: hepatitis C RNA by PCR, result: positive. Patient has had prior treatment for Hepatitis C. Patient does not have a past history of liver disease. Patient does not have a family history of liver disease.  He was treated 10 years ago but only with 2 weeks of treatment with PEG/Riba and stopped due to side effects.      Patient does have documented immunity to Hepatitis A. Patient does not have documented immunity to Hepatitis B.    Review of Systems:  Constitutional: Negative for fatigue, weight loss.  HENT: Negative for hearing loss, ear pain, neck pain, tinnitus and ear discharge.  Eyes: Negative for icterus, discharge and redness.  Respiratory: Negative fordypsnea, wheezing.  Cardiovascular: Negative for chest pain, palpitations, orthopnea, claudication and leg swelling.  Gastrointestinal: Negative for nausea, vomiting, abdominal distention and abdominal pain.Negative for  Genitourinary: Negative for dysuria, urgency, frequency, hematuria and flank pain.  Musculoskeletal: Negative for arthralgias, arthritis Skin: Negative for itching and rash. Neurological: Negative for dizziness and weakness. Endo/Heme/Allergies: Negative for environmental allergies and polydipsia. Does not bruise/bleed easily.      Past Medical History  Diagnosis Date  . Hepatitis C DX 2005    partial [facility] study  . Arthritis     R hip     Prior to Admission medications   Medication Sig Start Date End Date Taking? Authorizing Provider  ibuprofen (ADVIL,MOTRIN) 200 MG tablet Take 2 tablets (400 mg total) by mouth every 6 (six) hours as  needed for mild pain. 01/28/15  Yes Josalyn Funches, MD  cetirizine (ZYRTEC) 10 MG tablet Take 1 tablet (10 mg total) by mouth daily. Patient not taking: Reported on 04/03/2015 01/28/15   Boykin Nearing, MD  Elbasvir-Grazoprevir (ZEPATIER) 50-100 MG TABS Take 1 tablet by mouth daily. 04/03/15   Thayer Headings, MD    No Known Allergies  Social History  Substance Use Topics  . Smoking status: Former Smoker    Types: Cigarettes    Quit date: 01/08/2013  . Smokeless tobacco: Never Used  . Alcohol Use: No     Comment: former    Family History  Problem Relation Age of Onset  . Hypertension Mother   . Cancer Father     lung  . Colon cancer Neg Hx       Objective:   Filed Vitals:   04/03/15 1358  BP: 123/80  Pulse: 79  Temp: 97.7 F (36.5 C)   GEN: in no apparent distress and alert HEENT: anicteric Cardiac: Cor RRR and No murmurs Lungs: clear Abdomen: Bowel sounds are normal, liver is not enlarged, spleen is not enlarged Ext: peripheral pulses normal, no pedal edema, no clubbing or cyanosis Skin: negative for - jaundice, spider hemangioma, telangiectasia, palmar erythema, ecchymosis and atrophy Musculoskeletal: no joint effusions  Laboratory Genotype:  Lab Results  Component Value Date   HCVGENOTYPE 1b 03/07/2015   HCV viral load:  Lab Results  Component Value Date   HCVQUANT [id number]* 01/28/2015   HCVQUANT CANCELED 01/28/2015   Lab Results  Component Value Date   WBC 5.9 01/28/2015   HGB 13.7 01/28/2015   HCT 42.5 01/28/2015   MCV  87.8 01/28/2015   PLT 217 01/28/2015    Lab Results  Component Value Date   CREATININE 0.85 01/28/2015   BUN 15 01/28/2015   NA 141 01/28/2015   K 4.1 01/28/2015   CL 102 01/28/2015   CO2 27 01/28/2015    Lab Results  Component Value Date   ALT 65* 01/28/2015   AST 50* 01/28/2015   ALKPHOS 74 01/28/2015   BILITOT 0.7 01/28/2015   INR 1.11 03/07/2015      Assessment: Chronic Hepatitis C genotype 1b I discussed with  the patient the natural history and progression of chronic hepatitis C infection including about 30% of people who develop cirrhosis of the liver and once cirrhosis is established there is a 2-7% risk per year of liver cancer and liver failure.    Plan: 1) Patient counseled extensively on limiting acetaminophen to no more than 2 grams daily, avoidance of alcohol. 2) Transmission discussed with patient including sexual transmission, sharing razors and toothbrush.   3) Will need referral to gastroenterology if concern for cirrhosis 4) Will need referral for substance abuse counseling: No. 5) Will prescribe Zepatier for 12 weeks through [facility] 6) Hepatitis A vaccine No. 7) Hepatitis B vaccine Yes.   8) Pneumovax vaccine if concern for cirrhosis 9) will follow up after elastography

## 2015-04-17 ENCOUNTER — Ambulatory Visit: Payer: Self-pay | Attending: Family Medicine

## 2015-05-06 ENCOUNTER — Ambulatory Visit
Admission: RE | Admit: 2015-05-06 | Discharge: 2015-05-06 | Disposition: A | Discharge status: Home / Self Care | Payer: Worker's Compensation | Source: Ambulatory Visit | Attending: Nurse Practitioner | Admitting: Nurse Practitioner

## 2015-05-06 ENCOUNTER — Other Ambulatory Visit: Payer: Self-pay | Admitting: Nurse Practitioner

## 2015-05-06 DIAGNOSIS — M546 Pain in thoracic spine: Secondary | ICD-10-CM

## 2015-05-07 ENCOUNTER — Ambulatory Visit (HOSPITAL_COMMUNITY)
Admission: RE | Admit: 2015-05-07 | Discharge: 2015-05-07 | Disposition: A | Discharge status: Home / Self Care | Payer: Self-pay | Source: Ambulatory Visit | Attending: Internal Medicine | Admitting: Internal Medicine

## 2015-05-07 DIAGNOSIS — B182 Chronic viral hepatitis C: Secondary | ICD-10-CM | POA: Insufficient documentation

## 2015-05-07 DIAGNOSIS — N281 Cyst of kidney, acquired: Secondary | ICD-10-CM | POA: Insufficient documentation

## 2015-05-07 DIAGNOSIS — I77819 Aortic ectasia, unspecified site: Secondary | ICD-10-CM | POA: Insufficient documentation

## 2015-05-13 ENCOUNTER — Telehealth: Payer: Self-pay | Admitting: Pharmacy Technician

## 2015-05-14 ENCOUNTER — Encounter: Payer: Self-pay | Admitting: Internal Medicine

## 2015-05-14 ENCOUNTER — Ambulatory Visit (INDEPENDENT_AMBULATORY_CARE_PROVIDER_SITE_OTHER): Payer: Self-pay | Admitting: Internal Medicine

## 2015-05-14 VITALS — BP 131/86 | HR 80 | Temp 97.7°F | Wt 225.0 lb

## 2015-05-14 DIAGNOSIS — K7400 Hepatic fibrosis, unspecified: Secondary | ICD-10-CM | POA: Insufficient documentation

## 2015-05-14 DIAGNOSIS — Z23 Encounter for immunization: Secondary | ICD-10-CM

## 2015-05-14 DIAGNOSIS — B182 Chronic viral hepatitis C: Secondary | ICD-10-CM

## 2015-05-14 NOTE — Assessment & Plan Note (Signed)
Discussed elastography results.  Will recheck elastography in 1-2 years.

## 2015-05-14 NOTE — Progress Notes (Signed)
   Subjective:    Patient ID: Manuel Brady, male    DOB: 18-Jan-1956, 59 y.o.   MRN: [id number]  HPI Here for follow up of HCV.  Genotype 1b, viral load from outside lab.  Had elastography and is F2/3.  Waiting for medication approval through [facility].  No new issues.    Review of Systems  Constitutional: Negative for fatigue.  Gastrointestinal: Negative for nausea and diarrhea.  Skin: Negative for rash.  Neurological: Negative for dizziness and light-headedness.       Objective:   Physical Exam  Constitutional: He appears well-developed and well-nourished. No distress.  Eyes: No scleral icterus.  Cardiovascular: Normal rate, regular rhythm and normal heart sounds.   No murmur heard. Pulmonary/Chest: Effort normal and breath sounds normal. No respiratory distress.  Skin: No rash noted.          Assessment & Plan:

## 2015-05-14 NOTE — Assessment & Plan Note (Signed)
Waiting for approval

## 2015-05-15 ENCOUNTER — Encounter: Payer: Self-pay | Admitting: Pharmacy Technician

## 2015-06-03 ENCOUNTER — Ambulatory Visit: Payer: Self-pay | Admitting: Pharmacist Clinician (PhC)/ Clinical Pharmacy Specialist

## 2015-06-03 DIAGNOSIS — B182 Chronic viral hepatitis C: Secondary | ICD-10-CM

## 2015-06-03 NOTE — Progress Notes (Signed)
Patient ID: [name], male   DOB: September 15, 1955, 59 y.o.   MRN: [id number] HPI: Manuel Brady is a 59 y.o. male who is here for his 2 wks f/u.   Lab Results  Component Value Date   HCVGENOTYPE 1b 03/07/2015    Allergies: No Known Allergies  Vitals:    Past Medical History: Past Medical History  Diagnosis Date  . Hepatitis C DX 2005    partial [facility] study  . Arthritis     R hip     Social History: Social History   Social History  . Marital Status: Single    Spouse Name: N/A  . Number of Children: 4  . Years of Education: some colle   Occupational History  . [employer]    Social History Main Topics  . Smoking status: Former Smoker    Types: Cigarettes    Quit date: 01/08/2013  . Smokeless tobacco: Never Used  . Alcohol Use: No     Comment: former  . Drug Use: No     Comment: Last use two years ago (2014)  . Sexual Activity: Not Currently    Birth Control/ Protection: None   Other Topics Concern  . Not on file   Social History Narrative   Lives in a room boarding house     Labs: HEP B S AB (no units)  Date Value  03/07/2015 NEG   HEPATITIS B SURFACE AG (no units)  Date Value  03/07/2015 NEGATIVE   HCV AB (no units)  Date Value  01/28/2015 REACTIVE*    Lab Results  Component Value Date   HCVGENOTYPE 1b 03/07/2015    Hepatitis C RNA quantitative Latest Ref Rng 01/28/2015 01/28/2015  HCV Quantitative <15 IU/mL CANCELED 1348347(H)  HCV Quantitative Log <1.18 log 10 CANCELED 6.13(H)    AST (U/L)  Date Value  01/28/2015 50*   ALT (U/L)  Date Value  01/28/2015 65*   INR (no units)  Date Value  03/07/2015 1.11    CrCl: CrCl cannot be calculated (Unknown ideal weight.).  Fibrosis Score: F2/3 as assessed by [facility]  Child-Pugh Score: Class A  Previous Treatment Regimen: Peg/riba x ~2 wks  Assessment: Manuel Brady is here for his 2 wk f/u. He started zepatier around 10/9. He is doing very well on it and has  not missed any doses. Told him to call in for refill when he has about 10 days left. He got med through [facility]. He is going to come back in 2 wks for VL and f/u Dr. Linus Brady in January. No interaction issue with his meds.   Recommendations:  VL in 2 wks F/u Dr Manuel Brady in January  Manuel Brady, [place], [place].D., BCPS, AAHIVP Clinical Infectious [facility] 06/03/2015, 9:37 AM

## 2015-06-12 ENCOUNTER — Ambulatory Visit: Payer: Self-pay | Attending: Family Medicine | Admitting: Family Medicine

## 2015-06-12 ENCOUNTER — Encounter: Payer: Self-pay | Admitting: Family Medicine

## 2015-06-12 VITALS — BP 127/81 | HR 78 | Temp 98.1°F | Resp 16 | Ht 74.0 in | Wt 232.0 lb

## 2015-06-12 DIAGNOSIS — N281 Cyst of kidney, acquired: Secondary | ICD-10-CM | POA: Insufficient documentation

## 2015-06-12 DIAGNOSIS — Q6102 Congenital multiple renal cysts: Secondary | ICD-10-CM

## 2015-06-12 DIAGNOSIS — R911 Solitary pulmonary nodule: Secondary | ICD-10-CM

## 2015-06-12 NOTE — Progress Notes (Signed)
F/U spot on rt lung on MRI 2 weeks ago  No SHOB  No hx tobacco  No pain

## 2015-06-12 NOTE — Progress Notes (Signed)
Patient ID: Manuel Brady, male   DOB: February 21, 1956, 59 y.o.   MRN: [id number]   Subjective:  Patient ID: Manuel Brady, male    DOB: Mar 06, 1956  Age: 59 y.o. MRN: [id number]  CC: Follow-up   HPI Manuel Brady presents for  Lung nodule   1. Lung nodule: incidental finding done on thoracic MRI obtained for thoracic sprain that occurred during a work injury. Imaging done at [facility]. Patient has brought in CD of images.  Patient is a former smoker. He denies fever, chills, weight loss, cough, CP and SOB.   MRI thoracic spine wo contrast TECHNIQUE: MRI of the thoracic spine is performed without IV contrast.  INDICATION: Thoracic strain  FINDINGS: #  Vertebral bodies: Intact without fracture or destructive bone lesion. #  Alignment: Minimal thoracic curvature convex to the right. No spondylolisthesis. #  Marrow signal: Within normal limits #  Thoracic discs: Minimal central protrusion L2-L3. Small left protrusion T3-T4-T5. Tiny central protrusion T6-T7. Disc normal at the other thoracic levels. #  Enhancement: Not applicable #  Neuroforamen: Mild scoliotic compression on the left at T5-C6, T6-T7 and T7-T8. Focal foraminal encroachment from the superior facet on the left at T11-T12. #  Spinal canal: Normal without stenosis. No intradural or epidural abnormality appreciated. Cord is normal. #  Other findings:  There is a 1.5 cm nodule in the posterior right lung base. This appears well marginated and could be a granuloma or metastasis. Primary cancer not excluded. Bilateral renal cysts.   IMPRESSION: Minimal thoracic curvature and upper thoracic tiny disc protrusions. No significant central stenosis. No intradural pathology. Mild left foraminal narrowing.  Images on CD reviewed Radiologist at [facility] called   Right lower lobe 1.5 cm lung nodule. Bilateral renal cysts.  Electronically Signed by Ed Blalock  Social History  Substance Use Topics  . Smoking status: Former Smoker     Types: Cigarettes    Quit date: 01/08/2013  . Smokeless tobacco: Never Used  . Alcohol Use: No     Comment: former    Outpatient Prescriptions Prior to Visit  Medication Sig Dispense Refill  . cetirizine (ZYRTEC) 10 MG tablet Take 1 tablet (10 mg total) by mouth daily. 30 tablet 11  . Elbasvir-Grazoprevir (ZEPATIER) 50-100 MG TABS Take 1 tablet by mouth daily. 28 tablet 2  . HYDROcodone-acetaminophen (NORCO/VICODIN) 5-325 MG tablet Take 1 tablet by mouth every 6 (six) hours as needed for moderate pain.    Marland Kitchen ibuprofen (ADVIL,MOTRIN) 200 MG tablet Take 2 tablets (400 mg total) by mouth every 6 (six) hours as needed for mild pain. (Patient taking differently: Take 800 mg by mouth every 6 (six) hours as needed for mild pain. ) 30 tablet   . methocarbamol (ROBAXIN) 750 MG tablet Take 750 mg by mouth 4 (four) times daily.    . traMADol (ULTRAM) 50 MG tablet Take 50 mg by mouth every 6 (six) hours as needed.     No facility-administered medications prior to visit.    ROS Review of Systems  Constitutional: Negative for fever, chills, fatigue and unexpected weight change.  Eyes: Negative for visual disturbance.  Respiratory: Negative for cough and shortness of breath.   Cardiovascular: Negative for chest pain, palpitations and leg swelling.  Gastrointestinal: Negative for nausea, vomiting, abdominal pain, diarrhea, constipation and blood in stool.  Endocrine: Negative for polydipsia, polyphagia and polyuria.  Musculoskeletal: Positive for myalgias and back pain. Negative for arthralgias, gait problem and neck pain.  Skin: Negative for rash.  Allergic/Immunologic: Negative for immunocompromised state.  Hematological: Negative for adenopathy. Does not bruise/bleed easily.  Psychiatric/Behavioral: Negative for suicidal ideas, sleep disturbance and dysphoric mood. The patient is not nervous/anxious.     Objective:  BP 127/81 mmHg  Pulse 78  Temp(Src) 98.1 F (36.7 C) (Oral)  Resp 16   Ht 6\' 2"  (1.88 m)  Wt 232 lb (105.235 kg)  BMI 29.77 kg/m2  SpO2 97%  BP/Weight 06/12/2015 05/14/2015 7/94/3276  Systolic BP 147 092 957  Diastolic BP 81 86 80  Wt. (Lbs) 232 225 227  BMI 29.77 28.88 29.13   Physical Exam  Constitutional: He appears well-developed and well-nourished. No distress.  HENT:  Head: Normocephalic and atraumatic.  Neck: Normal range of motion. Neck supple.  Cardiovascular: Normal rate, regular rhythm, normal heart sounds and intact distal pulses.   Pulmonary/Chest: Effort normal and breath sounds normal.  Musculoskeletal: He exhibits no edema.  Neurological: He is alert.  Skin: Skin is warm and dry. No rash noted. No erythema.  Psychiatric: He has a normal mood and affect.    Assessment & Plan:   Problem List Items Addressed This Visit    Bilateral renal cysts   Incidental lung nodule, greater than or equal to 74mm - Primary   Relevant Orders   NM PET Image Initial (PI) Skull Base To Thigh      No orders of the defined types were placed in this encounter.    Follow-up: No Follow-up on file.   Boykin Nearing MD

## 2015-06-12 NOTE — Patient Instructions (Addendum)
Brenden was seen today for follow-up.  Diagnoses and all orders for this visit:  Incidental lung nodule, greater than or equal to 52mm -     Cancel: NM PET Image Initial (PI) Skull Base To Thigh; Future -     CT Chest Wo Contrast; Future  Bilateral renal cysts    PET scan scheduled to further evaluate your R lung nodule. You will be called with results and follow up plan  Dr. Adrian Blackwater

## 2015-06-12 NOTE — Assessment & Plan Note (Signed)
A: incidental lung nodule. I spoke to radiologist at [facility] who recommend CT w/o contrast with evaluate nodule P: CT chest w/o contrast ordered

## 2015-06-14 ENCOUNTER — Ambulatory Visit (HOSPITAL_COMMUNITY)
Admission: RE | Admit: 2015-06-14 | Discharge: 2015-06-14 | Disposition: A | Discharge status: Home / Self Care | Payer: Self-pay | Source: Ambulatory Visit | Attending: Family Medicine | Admitting: Family Medicine

## 2015-06-14 DIAGNOSIS — R911 Solitary pulmonary nodule: Secondary | ICD-10-CM | POA: Insufficient documentation

## 2015-06-14 DIAGNOSIS — K449 Diaphragmatic hernia without obstruction or gangrene: Secondary | ICD-10-CM | POA: Insufficient documentation

## 2015-06-17 ENCOUNTER — Other Ambulatory Visit: Payer: Self-pay

## 2015-06-17 ENCOUNTER — Telehealth: Payer: Self-pay | Admitting: Family Medicine

## 2015-06-17 DIAGNOSIS — B182 Chronic viral hepatitis C: Secondary | ICD-10-CM

## 2015-06-17 NOTE — Telephone Encounter (Signed)
Patient came into [facility] to get results, patient verified date of birth. Dr. Adrian Blackwater called him and he was in the bank so he came in to get results. Patient aware of 19 mm fat density nodule in R lung base with associated small diaphragmatic hernia. Patient questions if this is good or bad and what needs to be done. Nurse consulted with Dr. Adrian Blackwater. Per Dr. Adrian Blackwater, these are good results and there is nothing further to be done.  Patient voices understanding and has no further questions at this time.

## 2015-06-17 NOTE — Telephone Encounter (Signed)
Called patient Left VM to call back or check mychart for CT results If patient calls please back clinical staff to give him the following info:   19 mm fat density nodule in R lung base with associated small diaphragmatic hernia

## 2015-06-17 NOTE — Telephone Encounter (Signed)
Pt. Returned call. Please f/u with pt. °

## 2015-06-17 NOTE — Telephone Encounter (Signed)
Pt. Came in today requesting his MRI results. Please f/u with pt.

## 2015-06-17 NOTE — Telephone Encounter (Signed)
Patient called with questions about recent CT Scan result. Patient would like a phone call from the doctor to discuss Please follow up with patient.

## 2015-06-19 LAB — HEPATITIS C RNA QUANTITATIVE: HCV Quantitative: NOT DETECTED IU/mL (ref <15)

## 2015-07-08 ENCOUNTER — Telehealth: Payer: Self-pay | Admitting: Pharmacist Clinician (PhC)/ Clinical Pharmacy Specialist

## 2015-07-08 NOTE — Telephone Encounter (Signed)
Zev called about his Zepatier. He is on the last month. He couldn't find the number of the pharmacy. I was able to do it online for him.

## 2015-07-29 ENCOUNTER — Ambulatory Visit: Payer: Self-pay | Admitting: Family Medicine

## 2015-08-06 ENCOUNTER — Encounter: Payer: Self-pay | Admitting: Family Medicine

## 2015-08-06 ENCOUNTER — Ambulatory Visit: Payer: Self-pay | Attending: Family Medicine | Admitting: Family Medicine

## 2015-08-06 VITALS — BP 113/74 | HR 67 | Temp 98.7°F | Resp 16 | Ht 74.0 in | Wt 231.0 lb

## 2015-08-06 DIAGNOSIS — B001 Herpesviral vesicular dermatitis: Secondary | ICD-10-CM | POA: Insufficient documentation

## 2015-08-06 DIAGNOSIS — K0889 Other specified disorders of teeth and supporting structures: Secondary | ICD-10-CM | POA: Insufficient documentation

## 2015-08-06 DIAGNOSIS — Z87891 Personal history of nicotine dependence: Secondary | ICD-10-CM | POA: Insufficient documentation

## 2015-08-06 MED ORDER — ACYCLOVIR 400 MG PO TABS: 400.0000 mg | ORAL_TABLET | Freq: Three times a day (TID) | ORAL | Status: DC

## 2015-08-06 NOTE — Assessment & Plan Note (Signed)
Dental pain Referral placed

## 2015-08-06 NOTE — Progress Notes (Signed)
Dental referral  No pain today  No tobacco user  No suicidal thought in the past two weeks

## 2015-08-06 NOTE — Assessment & Plan Note (Signed)
Cold sore Acyclovir started

## 2015-08-06 NOTE — Progress Notes (Signed)
Subjective:  Patient ID: Manuel Brady, male    DOB: June 17, 1956  Age: 59 y.o. MRN: [id number]  CC: Dental Pain and Mouth Lesions   HPI Manuel Brady presents for    1. Dental pain: pain in teeth. No swelling or bleeding. ? Cavities.  2. Cold sore: on lower lip x 2 days. Has hx of severe cold sore complicated with secondary bacterial infection requiring antibiotics. No fever or chills.    Social History  Substance Use Topics  . Smoking status: Former Smoker    Types: Cigarettes    Quit date: 01/08/2013  . Smokeless tobacco: Never Used  . Alcohol Use: No     Comment: former    Outpatient Prescriptions Prior to Visit  Medication Sig Dispense Refill  . cetirizine (ZYRTEC) 10 MG tablet Take 1 tablet (10 mg total) by mouth daily. 30 tablet 11  . Elbasvir-Grazoprevir (ZEPATIER) 50-100 MG TABS Take 1 tablet by mouth daily. 28 tablet 2  . HYDROcodone-acetaminophen (NORCO/VICODIN) 5-325 MG tablet Take 1 tablet by mouth every 6 (six) hours as needed for moderate pain.    Marland Kitchen ibuprofen (ADVIL,MOTRIN) 200 MG tablet Take 2 tablets (400 mg total) by mouth every 6 (six) hours as needed for mild pain. (Patient taking differently: Take 800 mg by mouth every 6 (six) hours as needed for mild pain. ) 30 tablet   . methocarbamol (ROBAXIN) 750 MG tablet Take 750 mg by mouth 4 (four) times daily.    . traMADol (ULTRAM) 50 MG tablet Take 50 mg by mouth every 6 (six) hours as needed.     No facility-administered medications prior to visit.    ROS Review of Systems  Constitutional: Negative for fever, chills, fatigue and unexpected weight change.  HENT: Positive for dental problem and mouth sores.   Eyes: Negative for visual disturbance.  Respiratory: Negative for cough and shortness of breath.   Cardiovascular: Negative for chest pain, palpitations and leg swelling.  Gastrointestinal: Negative for nausea, vomiting, abdominal pain, diarrhea, constipation and blood in stool.  Endocrine:  Negative for polydipsia, polyphagia and polyuria.  Musculoskeletal: Negative for myalgias, back pain, arthralgias, gait problem and neck pain.  Skin: Negative for rash.  Allergic/Immunologic: Negative for immunocompromised state.  Hematological: Negative for adenopathy. Does not bruise/bleed easily.  Psychiatric/Behavioral: Negative for suicidal ideas, sleep disturbance and dysphoric mood. The patient is not nervous/anxious.     Objective:  BP 113/74 mmHg  Pulse 67  Temp(Src) 98.7 F (37.1 C) (Oral)  Resp 16  Ht 6\' 2"  (1.88 m)  Wt 231 lb (104.781 kg)  BMI 29.65 kg/m2  SpO2 97%  BP/Weight 08/06/2015 06/12/2015 [id number]  Systolic BP 123456 [id number]  Diastolic BP 74 81 86  Wt. (Lbs) 231 232 225  BMI 29.65 29.77 28.88   Physical Exam  Constitutional: He appears well-developed and well-nourished. No distress.  HENT:  Head: Normocephalic and atraumatic.  Mouth/Throat:    Neck: Normal range of motion. Neck supple.  Cardiovascular: Normal rate, regular rhythm, normal heart sounds and intact distal pulses.   Pulmonary/Chest: Effort normal and breath sounds normal.  Musculoskeletal: He exhibits no edema.  Neurological: He is alert.  Skin: Skin is warm and dry. No rash noted. No erythema.  Psychiatric: He has a normal mood and affect.    Assessment & Plan:   Problem List Items Addressed This Visit    Cold sore    Cold sore Acyclovir started       Relevant Medications   acyclovir (  ZOVIRAX) 400 MG tablet   Pain, dental - Primary    Dental pain Referral placed       Relevant Orders   Ambulatory referral to Dentistry      No orders of the defined types were placed in this encounter.    Follow-up: No Follow-up on file.   Boykin Nearing MD

## 2015-08-06 NOTE — Patient Instructions (Signed)
Manuel Brady was seen today for dental pain.  Diagnoses and all orders for this visit:  Pain, dental -     Ambulatory referral to Dentistry  Cold sore -     acyclovir (ZOVIRAX) 400 MG tablet; Take 1 tablet (400 mg total) by mouth 3 (three) times daily. For 5 days    F/u in 6 months, sooner if needed  Dr. Hannah Beat Sore A cold sore (fever blister) is a skin infection caused by the herpes simplex virus (HSV-1). HSV-1 is closely related to the virus that causes genital herpes (HSV-2), but they are not the same even though both viruses can cause oral and genital infections. Cold sores are small, fluid-filled sores inside of the mouth or on the lips, gums, nose, chin, cheeks, or fingers.  The herpes simplex virus can be easily passed (contagious) to other people through close personal contact, such as kissing or sharing personal items. The virus can also spread to other parts of the body, such as the eyes or genitals. Cold sores are contagious until the sores crust over completely. They often heal within 2 weeks.  Once a person is infected, the herpes simplex virus remains permanently in the body. Therefore, there is no cure for cold sores, and they often recur when a person is tired, stressed, sick, or gets too much sun. Additional factors that can cause a recurrence include hormone changes in menstruation or pregnancy, certain drugs, and cold weather.  CAUSES  Cold sores are caused by the herpes simplex virus. The virus is spread from person to person through close contact, such as through kissing, touching the affected area, or sharing personal items such as lip balm, razors, or eating utensils.  SYMPTOMS  The first infection may not cause symptoms. If symptoms develop, the symptoms often go through different stages. Here is how a cold sore develops:   Tingling, itching, or burning is felt 1-2 days before the outbreak.   Fluid-filled blisters appear on the lips, inside the mouth, nose, or on  the cheeks.   The blisters start to ooze clear fluid.   The blisters dry up and a yellow crust appears in its place.   The crust falls off.  Symptoms depend on whether it is the initial outbreak or a recurrence. Some other symptoms with the first outbreak may include:   Fever.   Sore throat.   Headache.   Muscle aches.   Swollen neck glands.  DIAGNOSIS  A diagnosis is often made based on your symptoms and looking at the sores. Sometimes, a sore may be swabbed and then examined in the lab to make a final diagnosis. If the sores are not present, blood tests can find the herpes simplex virus.  TREATMENT  There is no cure for cold sores and no vaccine for the herpes simplex virus. Within 2 weeks, most cold sores go away on their own without treatment. Medicines cannot make the infection go away, but medicine can help relieve some of the pain associated with the sores, can work to stop the virus from multiplying, and can also shorten healing time. Medicine may be in the form of creams, gels, pills, or a shot.  HOME CARE INSTRUCTIONS   Only take over-the-counter or prescription medicines for pain, discomfort, or fever as directed by your caregiver. Do not use aspirin.   Use a cotton-tip swab to apply creams or gels to your sores.   Do not touch the sores or pick the scabs. Wash your  hands often. Do not touch your eyes without washing your hands first.   Avoid kissing, oral sex, and sharing personal items until sores heal.   Apply an ice pack on your sores for 10-15 minutes to ease any discomfort.   Avoid hot, cold, or salty foods because they may hurt your mouth. Eat a soft, bland diet to avoid irritating the sores. Use a straw to drink if you have pain when drinking out of a glass.   Keep sores clean and dry to prevent an infection of other tissues.   Avoid the sun and limit stress if these things trigger outbreaks. If sun causes cold sores, apply sunscreen on the  lips before being out in the sun.  SEEK MEDICAL CARE IF:   You have a fever or persistent symptoms for more than 2-3 days.   You have a fever and your symptoms suddenly get worse.   You have pus, not clear fluid, coming from the sores.   You have redness that is spreading.   You have pain or irritation in your eye.   You get sores on your genitals.   Your sores do not heal within 2 weeks.   You have a weakened immune system.   You have frequent recurrences of cold sores.  MAKE SURE YOU:   Understand these instructions.  Will watch your condition.  Will get help right away if you are not doing well or get worse.   This information is not intended to replace advice given to you by your health care provider. Make sure you discuss any questions you have with your health care provider.   Document Released: 07/24/2000 Document Revised: 08/17/2014 Document Reviewed: 12/09/2011 Elsevier Interactive Patient Education [employer].

## 2015-08-15 ENCOUNTER — Encounter: Payer: Self-pay | Admitting: Internal Medicine

## 2015-08-15 ENCOUNTER — Ambulatory Visit (INDEPENDENT_AMBULATORY_CARE_PROVIDER_SITE_OTHER): Payer: Self-pay | Admitting: Internal Medicine

## 2015-08-15 VITALS — BP 143/84 | HR 73 | Temp 98.2°F | Wt 234.0 lb

## 2015-08-15 DIAGNOSIS — K7400 Hepatic fibrosis, unspecified: Secondary | ICD-10-CM

## 2015-08-15 DIAGNOSIS — Z23 Encounter for immunization: Secondary | ICD-10-CM

## 2015-08-15 DIAGNOSIS — B182 Chronic viral hepatitis C: Secondary | ICD-10-CM

## 2015-08-15 NOTE — Progress Notes (Signed)
   Subjective:    Patient ID: Manuel Brady, male    DOB: 08/19/1955, 60 y.o.   MRN: [id number]  HPI Here for follow up of HCV.  Genotype 1b, viral load from outside lab.  Had elastography and is F2/3.  Started in October and early viral load undetectable.   No new issues. Has just 2 pills left.  Some tiredness but also recent back injury and not as active.    Review of Systems  Constitutional: Negative for fatigue.  Gastrointestinal: Negative for nausea and diarrhea.  Skin: Negative for rash.  Neurological: Negative for dizziness and light-headedness.       Objective:   Physical Exam  Constitutional: He appears well-developed and well-nourished. No distress.  Eyes: No scleral icterus.  Cardiovascular: Normal rate, regular rhythm and normal heart sounds.   No murmur heard. Pulmonary/Chest: Effort normal and breath sounds normal. No respiratory distress.  Skin: No rash noted.          Assessment & Plan:

## 2015-08-15 NOTE — Addendum Note (Signed)
Addended by: Myrtis Hopping A on: 08/15/2015 10:04 AM   Modules accepted: Orders

## 2015-08-15 NOTE — Assessment & Plan Note (Signed)
Viral load in 2 weeks then rtc 4 months for SVR12.

## 2015-08-15 NOTE — Assessment & Plan Note (Signed)
Will repeat elastography next year to see if further follow up needed.

## 2015-09-11 ENCOUNTER — Encounter (HOSPITAL_COMMUNITY): Payer: Self-pay | Admitting: Emergency Medicine

## 2015-09-11 ENCOUNTER — Emergency Department (INDEPENDENT_AMBULATORY_CARE_PROVIDER_SITE_OTHER)
Admission: EM | Admit: 2015-09-11 | Discharge: 2015-09-11 | Disposition: A | Discharge status: Home / Self Care | Payer: No Typology Code available for payment source | Source: Home / Self Care | Attending: Family Medicine | Admitting: Family Medicine

## 2015-09-11 DIAGNOSIS — L989 Disorder of the skin and subcutaneous tissue, unspecified: Secondary | ICD-10-CM

## 2015-09-11 DIAGNOSIS — R238 Other skin changes: Secondary | ICD-10-CM

## 2015-09-11 NOTE — Discharge Instructions (Signed)
For now just place warm compresses over the bump on the side of your neck. If it develops into painful clear blisters, increased redness, scabbing sick medical attention promptly so medicine can be most effective.

## 2015-09-11 NOTE — ED Provider Notes (Signed)
CSN: [id number]     Arrival date & time 09/11/15  1302 History   First MD Initiated Contact with Patient 09/11/15 1321     Chief Complaint  Patient presents with  . Rash   (Consider location/radiation/quality/duration/timing/severity/associated sxs/prior Treatment) HPI Comments: 60 year old man concerned about a small pimple-like lesion to the right side of his neck. He states it is mildly tender. It developed 2-3 days ago. He states that he is concerned about shingles because he has had shingles on his torso in the past.   Past Medical History  Diagnosis Date  . Hepatitis C DX 2005    partial [facility] study  . Arthritis     R hip    Past Surgical History  Procedure Laterality Date  . Arm surgery Left 2003     plates in arm, arm caught in machine, crush injury   . Knee surgery Left 2005   . Colonoscopy N/A 03/05/2015    Procedure: COLONOSCOPY;  Surgeon: Danie Binder, MD;  Location: [facility];  Service: Endoscopy;  Laterality: N/A;  11:15 AM   Family History  Problem Relation Age of Onset  . Hypertension Mother   . Cancer Father     lung  . Colon cancer Neg Hx    Social History  Substance Use Topics  . Smoking status: Former Smoker    Types: Cigarettes    Quit date: 01/08/2013  . Smokeless tobacco: Never Used  . Alcohol Use: No     Comment: former    Review of Systems  Constitutional: Negative.   HENT: Negative.   Respiratory: Negative.   Skin:       As per history of present illness  Neurological: Negative.   All other systems reviewed and are negative.   Allergies  Review of patient's allergies indicates no known allergies.  Home Medications   Prior to Admission medications   Medication Sig Start Date End Date Taking? Authorizing Provider  Elbasvir-Grazoprevir (ZEPATIER) 50-100 MG TABS Take 1 tablet by mouth daily. Patient not taking: Reported on 09/11/2015 04/03/15   Thayer Headings, MD  ibuprofen (ADVIL,MOTRIN) 200 MG tablet Take 2 tablets (400 mg total) by  mouth every 6 (six) hours as needed for mild pain. Patient taking differently: Take 800 mg by mouth every 6 (six) hours as needed for mild pain.  01/28/15   Boykin Nearing, MD   Meds Ordered and Administered this Visit  Medications - No data to display  BP 132/90 mmHg  Pulse 78  Temp(Src) 98 F (36.7 C) (Oral)  Resp 18  SpO2 97% No data found.   Physical Exam  Constitutional: He appears well-developed and well-nourished. No distress.  Neck: Normal range of motion. Neck supple.  Cardiovascular: Normal rate and regular rhythm.   Pulmonary/Chest: Effort normal. No respiratory distress.  Musculoskeletal: Normal range of motion. He exhibits no edema.  Neurological: He is alert. No cranial nerve deficit. He exhibits normal muscle tone.  Skin: Skin is warm and dry.  There is a small flesh-colored papule to the right side of the patient's neck. Mildly tender. No vesicle formation. No pustule formation. No drainage or exudate. No surrounding erythema. No other lesions are observed. This most closely resembles a pimple.  Psychiatric: He has a normal mood and affect.  Nursing note and vitals reviewed.   ED Course  Procedures (including critical care time)  Labs Review Labs Reviewed - No data to display  Imaging Review No results found.   Visual Acuity Review  Right Eye Distance:   Left Eye Distance:   Bilateral Distance:    Right Eye Near:   Left Eye Near:    Bilateral Near:         MDM   1. Papule    Warm compresses. We discuss signs and symptoms of shingles and should any of these develop return for medical attention promptly.    Janne Napoleon, NP 09/11/15 1359

## 2015-09-11 NOTE — ED Notes (Signed)
Noticed a painful blister/bump to right side of neck  Patient is concerned for shingles.  History of the same.  Intermittent itching. Noticed bump yesterday

## 2015-09-16 ENCOUNTER — Emergency Department (HOSPITAL_COMMUNITY)
Admission: EM | Admit: 2015-09-16 | Discharge: 2015-09-16 | Disposition: A | Discharge status: Home / Self Care | Payer: No Typology Code available for payment source | Attending: Emergency Medicine | Admitting: Emergency Medicine

## 2015-09-16 ENCOUNTER — Encounter (HOSPITAL_COMMUNITY): Payer: Self-pay | Admitting: Cardiology

## 2015-09-16 DIAGNOSIS — N50819 Testicular pain, unspecified: Secondary | ICD-10-CM | POA: Insufficient documentation

## 2015-09-16 DIAGNOSIS — Z87440 Personal history of urinary (tract) infections: Secondary | ICD-10-CM | POA: Insufficient documentation

## 2015-09-16 DIAGNOSIS — R350 Frequency of micturition: Secondary | ICD-10-CM | POA: Insufficient documentation

## 2015-09-16 DIAGNOSIS — R1030 Lower abdominal pain, unspecified: Secondary | ICD-10-CM | POA: Insufficient documentation

## 2015-09-16 DIAGNOSIS — Z8619 Personal history of other infectious and parasitic diseases: Secondary | ICD-10-CM | POA: Insufficient documentation

## 2015-09-16 DIAGNOSIS — M1611 Unilateral primary osteoarthritis, right hip: Secondary | ICD-10-CM | POA: Insufficient documentation

## 2015-09-16 DIAGNOSIS — Z87891 Personal history of nicotine dependence: Secondary | ICD-10-CM | POA: Insufficient documentation

## 2015-09-16 LAB — CBC WITH DIFFERENTIAL/PLATELET
Basophils Absolute: 0 10*3/uL (ref 0.0–0.1)
Basophils Relative: 0 %
EOS ABS: 0.1 10*3/uL (ref 0.0–0.7)
EOS PCT: 2 %
HCT: 42.7 % (ref 39.0–52.0)
HEMOGLOBIN: 14 g/dL (ref 13.0–17.0)
LYMPHS ABS: 2.7 10*3/uL (ref 0.7–4.0)
Lymphocytes Relative: 47 %
MCH: 29 pg (ref 26.0–34.0)
MCHC: 32.8 g/dL (ref 30.0–36.0)
MCV: 88.4 fL (ref 78.0–100.0)
MONOS PCT: 7 %
Monocytes Absolute: 0.4 10*3/uL (ref 0.1–1.0)
NEUTROS PCT: 44 %
Neutro Abs: 2.5 10*3/uL (ref 1.7–7.7)
Platelets: 214 10*3/uL (ref 150–400)
RBC: 4.83 MIL/uL (ref 4.22–5.81)
RDW: 12.8 % (ref 11.5–15.5)
WBC: 5.8 10*3/uL (ref 4.0–10.5)

## 2015-09-16 LAB — URINALYSIS, ROUTINE W REFLEX MICROSCOPIC
BILIRUBIN URINE: NEGATIVE
Glucose, UA: NEGATIVE mg/dL
HGB URINE DIPSTICK: NEGATIVE
Ketones, ur: NEGATIVE mg/dL
Leukocytes, UA: NEGATIVE
Nitrite: NEGATIVE
Protein, ur: NEGATIVE mg/dL
SPECIFIC GRAVITY, URINE: 1.017 (ref 1.005–1.030)
pH: 6.5 (ref 5.0–8.0)

## 2015-09-16 LAB — BASIC METABOLIC PANEL
Anion gap: 11 (ref 5–15)
BUN: 9 mg/dL (ref 6–20)
CO2: 27 mmol/L (ref 22–32)
Calcium: 9.5 mg/dL (ref 8.9–10.3)
Chloride: 104 mmol/L (ref 101–111)
Creatinine, Ser: 0.96 mg/dL (ref 0.61–1.24)
GFR calc Af Amer: >60 mL/min (ref >60)
GLUCOSE: 99 mg/dL (ref 65–99)
Potassium: 4.5 mmol/L (ref 3.5–5.1)
SODIUM: 142 mmol/L (ref 135–145)

## 2015-09-16 NOTE — ED Notes (Signed)
Pt reports he has been having pelvic pain and dysuria for the past 2 days.

## 2015-09-16 NOTE — ED Notes (Signed)
Declined W/C at D/C and was escorted to lobby by RN. 

## 2015-09-16 NOTE — Discharge Instructions (Signed)
1. Medications: usual home medications 2. Treatment: rest, drink plenty of fluids 3. Follow Up: please followup with your primary doctor for discussion of your diagnoses and further evaluation after today's visit; if you do not have a primary care doctor use the resource guide provided to find one; please return to the ER for new or worsening symptoms   Abdominal Pain, Adult Many things can cause belly (abdominal) pain. Most times, the belly pain is not dangerous. Many cases of belly pain can be watched and treated at home. HOME CARE   Do not take medicines that help you go poop (laxatives) unless told to by your doctor.  Only take medicine as told by your doctor.  Eat or drink as told by your doctor. Your doctor will tell you if you should be on a special diet. GET HELP IF:  You do not know what is causing your belly pain.  You have belly pain while you are sick to your stomach (nauseous) or have runny poop (diarrhea).  You have pain while you pee or poop.  Your belly pain wakes you up at night.  You have belly pain that gets worse or better when you eat.  You have belly pain that gets worse when you eat fatty foods.  You have a fever. GET HELP RIGHT AWAY IF:   The pain does not go away within 2 hours.  You keep throwing up (vomiting).  The pain changes and is only in the right or left part of the belly.  You have bloody or tarry looking poop. MAKE SURE YOU:   Understand these instructions.  Will watch your condition.  Will get help right away if you are not doing well or get worse.   This information is not intended to replace advice given to you by your health care provider. Make sure you discuss any questions you have with your health care provider.   Document Released: 01/13/2008 Document Revised: 08/17/2014 Document Reviewed: 04/05/2013 Elsevier Interactive Patient Education [employer].

## 2015-09-16 NOTE — ED Provider Notes (Signed)
CSN: [id number]     Arrival date & time 09/16/15  0856 History   First MD Initiated Contact with Patient 09/16/15 0924   By signing my name below, I, Nicole Kindred, attest that this documentation has been prepared under the direction and in the presence of Millry. Derran Sear, PA-C.  Electronically Signed: Nicole Kindred, ED Scribe 09/16/2015 at 10:48 AM.   Chief Complaint  Patient presents with  . Pelvic Pain     The history is provided by the patient. No language interpreter was used.    HPI Comments: COLLIER KIRCHBERG is a 60 y.o. male who presents to the Emergency Department complaining of gradual onset, constant pelvic pain, onset a few days ago. Pt reports associated increased urinary frequency and testicular pain. Pt states that he has had a UTI in the past and his symptoms feel similar to that incident. No worsening or alleviating factors noted. He denies penile discharge, penile swelling, hematuria, dysuria, urgency, abdominal pain, nausea, vomiting, diarrhea, or constipation.    Past Medical History  Diagnosis Date  . Hepatitis C DX 2005    partial [facility] study  . Arthritis     R hip    Past Surgical History  Procedure Laterality Date  . Arm surgery Left 2003     plates in arm, arm caught in machine, crush injury   . Knee surgery Left 2005   . Colonoscopy N/A 03/05/2015    Procedure: COLONOSCOPY;  Surgeon: Danie Binder, MD;  Location: [facility];  Service: Endoscopy;  Laterality: N/A;  11:15 AM   Family History  Problem Relation Age of Onset  . Hypertension Mother   . Cancer Father     lung  . Colon cancer Neg Hx    Social History  Substance Use Topics  . Smoking status: Former Smoker    Types: Cigarettes    Quit date: 01/08/2013  . Smokeless tobacco: Never Used  . Alcohol Use: No     Comment: former     Review of Systems  HENT: Negative for sore throat.   Gastrointestinal: Negative for nausea, vomiting, diarrhea and constipation.  Genitourinary:  Positive for frequency and testicular pain. Negative for dysuria, urgency, hematuria, flank pain, discharge, penile swelling, scrotal swelling and penile pain.      Allergies  Review of patient's allergies indicates no known allergies.  Home Medications   Prior to Admission medications   Medication Sig Start Date End Date Taking? Authorizing Provider  Elbasvir-Grazoprevir (ZEPATIER) 50-100 MG TABS Take 1 tablet by mouth daily. Patient not taking: Reported on 09/11/2015 04/03/15   Thayer Headings, MD  ibuprofen (ADVIL,MOTRIN) 200 MG tablet Take 2 tablets (400 mg total) by mouth every 6 (six) hours as needed for mild pain. Patient taking differently: Take 800 mg by mouth every 6 (six) hours as needed for mild pain.  01/28/15   Josalyn Funches, MD    BP 133/113 mmHg  Pulse 69  Temp(Src) 97.6 F (36.4 C) (Oral)  Resp 20  SpO2 98% Physical Exam  Constitutional: He is oriented to person, place, and time. He appears well-developed and well-nourished. No distress.  HENT:  Head: Normocephalic and atraumatic.  Right Ear: External ear normal.  Left Ear: External ear normal.  Nose: Nose normal.  Mouth/Throat: Oropharynx is clear and moist.  Eyes: Conjunctivae and EOM are normal. Pupils are equal, round, and reactive to light. Right eye exhibits no discharge. Left eye exhibits no discharge. No scleral icterus.  Neck: Normal range  of motion. Neck supple.  Cardiovascular: Normal rate, regular rhythm, normal heart sounds and intact distal pulses.   Pulmonary/Chest: Effort normal and breath sounds normal. No respiratory distress. He has no wheezes. He has no rales.  Abdominal: Soft. Bowel sounds are normal. He exhibits no distension and no mass. There is tenderness. There is no rebound and no guarding.  Mild TTP in suprapubic region.  Genitourinary: Testes normal and penis normal. Right testis shows no mass, no swelling and no tenderness. Left testis shows no mass, no swelling and no tenderness. No  penile erythema or penile tenderness. No discharge found.  Musculoskeletal: Normal range of motion. He exhibits no edema or tenderness.  Lymphadenopathy:       Right: No inguinal adenopathy present.       Left: No inguinal adenopathy present.  Neurological: He is alert and oriented to person, place, and time.  Skin: Skin is warm and dry. He is not diaphoretic.  Psychiatric: He has a normal mood and affect. His behavior is normal.  Nursing note and vitals reviewed.   ED Course  Procedures (including critical care time)  DIAGNOSTIC STUDIES: Oxygen Saturation is 98% on RA, normal by my interpretation.    COORDINATION OF CARE: 9:45 AM-Discussed treatment plan which includes urinalysis with pt at bedside and pt agreed to plan.   Labs Review Labs Reviewed  URINALYSIS, ROUTINE W REFLEX MICROSCOPIC (NOT AT [facility]) - Abnormal; Notable for the following:    APPearance CLOUDY (*)    All other components within normal limits  CBC WITH DIFFERENTIAL/PLATELET  BASIC METABOLIC PANEL    Imaging Review No results found.   I have personally reviewed and evaluated these lab results as part of my medical decision-making.   EKG Interpretation None      MDM   Final diagnoses:  Suprapubic pain, unspecified laterality    60 year old male presents with pelvic pain and urinary frequency. Also notes testicular pain. Denies N/V/D/C, penile pain/swelling/discharge. Patient is afebrile. Vital signs stable. Abdomen soft, non-distended, with mild TTP in suprapubic region. No rebound, guarding, or masses. Nommal male GU exam. No testicular tenderness. No evidence of UTI on UA. CBC, BMP unremarkable. Patient is non-toxic and well-appearing, feel he is stable for discharge at this time. Doubt emergent intra-abdominal or pelvic pathology. Patient to follow-up with PCP. Return precautions discussed. Patient verbalizes his understanding and is in agreement with plan.  BP 133/113 mmHg  Pulse 69  Temp(Src)  97.6 F (36.4 C) (Oral)  Resp 20  SpO2 98%  I personally performed the services described in this documentation, which was scribed in my presence. The recorded information has been reviewed and is accurate.     Marella Chimes, PA-C 09/16/15 1941  Virgel Manifold, MD 09/17/15 [phone]

## 2015-09-24 NOTE — Telephone Encounter (Signed)
Spoke with Manuel Brady 3 times about coming in to sign application so can get Hep C med.  2 no shows, still needs to sign.

## 2015-10-10 ENCOUNTER — Encounter: Payer: Self-pay | Admitting: *Deleted

## 2015-10-10 NOTE — Progress Notes (Signed)
Patient ID: Manuel Brady, male   DOB: 08-05-1956, 60 y.o.   MRN: [id number] Patient requesting printout of his latest chest CT-patient signed release and information given

## 2015-10-18 ENCOUNTER — Encounter (HOSPITAL_COMMUNITY): Payer: Self-pay

## 2015-10-18 ENCOUNTER — Emergency Department (INDEPENDENT_AMBULATORY_CARE_PROVIDER_SITE_OTHER)
Admission: EM | Admit: 2015-10-18 | Discharge: 2015-10-18 | Disposition: A | Discharge status: Home / Self Care | Payer: Self-pay | Source: Home / Self Care | Attending: Emergency Medicine | Admitting: Emergency Medicine

## 2015-10-18 DIAGNOSIS — J069 Acute upper respiratory infection, unspecified: Secondary | ICD-10-CM

## 2015-10-18 NOTE — ED Provider Notes (Signed)
CSN: [id number]     Arrival date & time 10/18/15  1258 History   First MD Initiated Contact with Patient 10/18/15 1305     Chief Complaint  Patient presents with  . Chest Pain   (Consider location/radiation/quality/duration/timing/severity/associated sxs/prior Treatment) HPI History obtained from patient:   LOCATION:Upper resp SEVERITY: DURATION:several days CONTEXT:recent URI symptoms QUALITY: MODIFYING FACTORS:flu shot, otc meds ASSOCIATED SYMPTOMS:congestion TIMING: OCCUPATION:   Past Medical History  Diagnosis Date  . Hepatitis C DX 2005    partial [facility] study  . Arthritis     R hip    Past Surgical History  Procedure Laterality Date  . Arm surgery Left 2003     plates in arm, arm caught in machine, crush injury   . Knee surgery Left 2005   . Colonoscopy N/A 03/05/2015    Procedure: COLONOSCOPY;  Surgeon: Danie Binder, MD;  Location: [facility];  Service: Endoscopy;  Laterality: N/A;  11:15 AM   Family History  Problem Relation Age of Onset  . Hypertension Mother   . Cancer Father     lung  . Colon cancer Neg Hx    Social History  Substance Use Topics  . Smoking status: Former Smoker    Types: Cigarettes    Quit date: 01/08/2013  . Smokeless tobacco: Never Used  . Alcohol Use: No     Comment: former    Review of Systems ROS +'ve chest pain, cold symptoms  Denies: HEADACHE, NAUSEA, ABDOMINAL PAIN,  DYSURIA, SHORTNESS OF BREATH  Allergies  Review of patient's allergies indicates no known allergies.  Home Medications   Prior to Admission medications   Medication Sig Start Date End Date Taking? Authorizing Provider  ibuprofen (ADVIL,MOTRIN) 200 MG tablet Take 2 tablets (400 mg total) by mouth every 6 (six) hours as needed for mild pain. Patient taking differently: Take 800 mg by mouth every 6 (six) hours as needed for mild pain.  01/28/15  Yes Josalyn Funches, MD  Elbasvir-Grazoprevir (ZEPATIER) 50-100 MG TABS Take 1 tablet by mouth daily. Patient  not taking: Reported on 09/11/2015 04/03/15   Thayer Headings, MD   Meds Ordered and Administered this Visit  Medications - No data to display  BP 135/90 mmHg  Pulse 74  Temp(Src) 97.4 F (36.3 C) (Oral)  SpO2 97% No data found.   Physical Exam NURSES NOTES AND VITAL SIGNS REVIEWED. CONSTITUTIONAL: Well developed, well nourished, no acute distress HEENT: normocephalic, atraumatic, right and left TM's are normal EYES: Conjunctiva normal NECK:normal ROM, supple, no adenopathy PULMONARY:No respiratory distress, normal effort, Lungs: CTAb/l, no wheezes, or increased work of breathing CARDIOVASCULAR: RRR, no murmur ABDOMEN: soft, ND, NT, +'ve BS MUSCULOSKELETAL: Normal ROM of all extremities,  SKIN: warm and dry without rash PSYCHIATRIC: Mood and affect, behavior are normal  ED Course  Procedures (including critical care time)  Labs Review Labs Reviewed - No data to display  Imaging Review No results found.   Visual Acuity Review  Right Eye Distance:   Left Eye Distance:   Bilateral Distance:    Right Eye Near:   Left Eye Near:    Bilateral Near:         MDM   1. URI (upper respiratory infection)      Patient is reassured that there are no issues that require transfer to higher level of care at this time or additional tests. Patient is advised to continue home symptomatic treatment. Patient is advised that if there are new or worsening symptoms  to attend the emergency department, contact primary care provider, or return to UC. Instructions of care provided discharged home in stable condition. Return to work/school note provided.   THIS NOTE WAS GENERATED USING A VOICE RECOGNITION SOFTWARE PROGRAM. ALL REASONABLE EFFORTS  WERE MADE TO PROOFREAD THIS DOCUMENT FOR ACCURACY.  I have verbally reviewed the discharge instructions with the patient. A printed AVS was given to the patient.  All questions were answered prior to discharge.      Konrad Felix,  PA 10/18/15 1400

## 2015-10-18 NOTE — ED Notes (Signed)
59 y.o./male patient presents with chest pain and cold chills x2 days. Pt has taken ibuprofen and Advil cold &sinus about 2hrs ago. No acute distress

## 2015-10-18 NOTE — Discharge Instructions (Signed)

## 2015-10-28 ENCOUNTER — Ambulatory Visit: Payer: Self-pay | Attending: Family Medicine

## 2015-12-03 ENCOUNTER — Other Ambulatory Visit: Payer: Self-pay

## 2015-12-03 DIAGNOSIS — B182 Chronic viral hepatitis C: Secondary | ICD-10-CM

## 2015-12-05 LAB — HEPATITIS C RNA QUANTITATIVE: HCV Quantitative: NOT DETECTED IU/mL (ref <15)

## 2015-12-16 ENCOUNTER — Ambulatory Visit: Payer: Self-pay | Admitting: Internal Medicine

## 2015-12-17 ENCOUNTER — Ambulatory Visit: Payer: Self-pay | Admitting: Internal Medicine

## 2016-04-06 IMAGING — CR DG THORACIC SPINE 3V
4 series · 4 of 4 positions shown · non-contrast
Comparison: Chest radiograph 01/13/2014

CLINICAL DATA: Pain in thoracolumbar spine after lifting track can
laterality, cannot lie flat

EXAM:
THORACIC SPINE - 3 VIEWS

[w thoracic spine ap (1 of 2)]
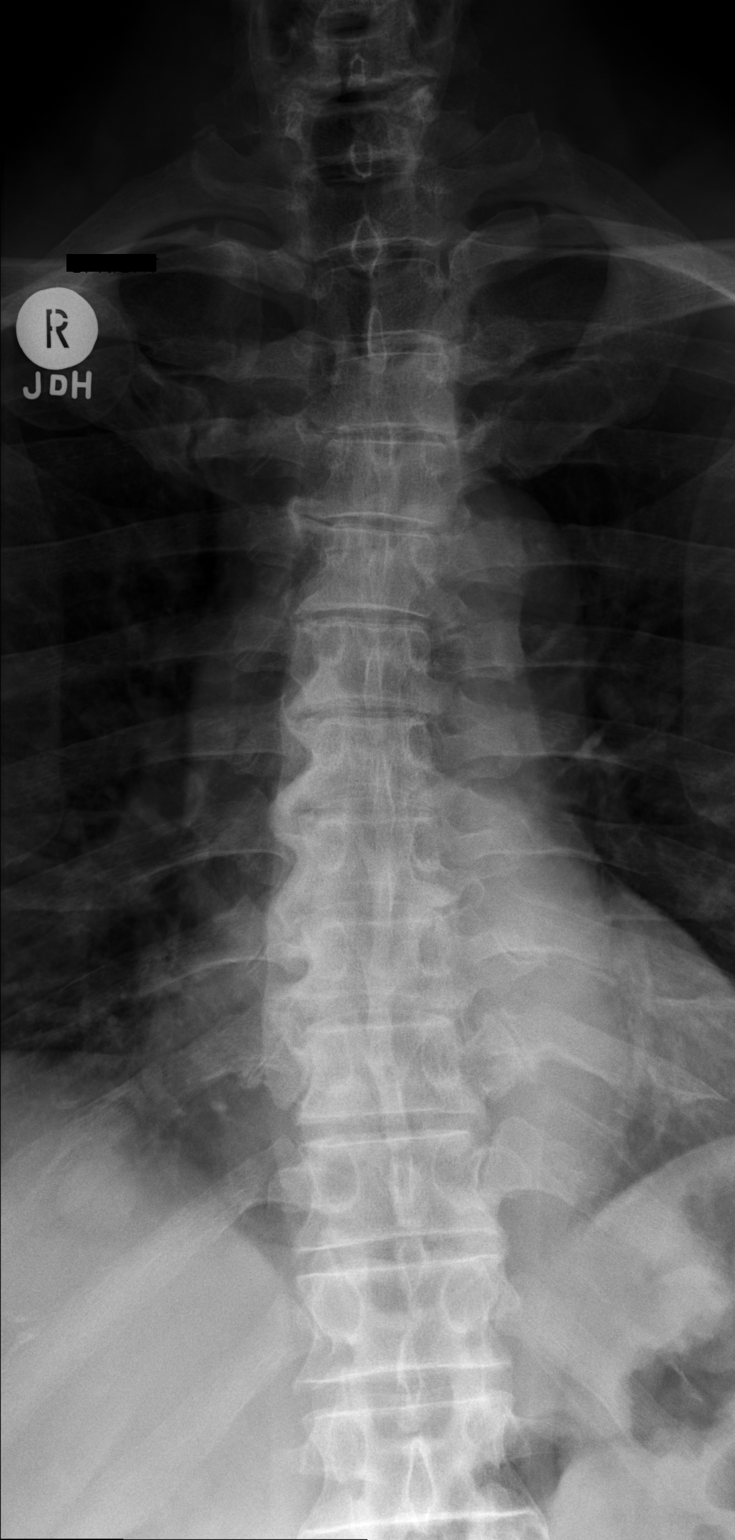

[w thoracic spine ap (2 of 2)]
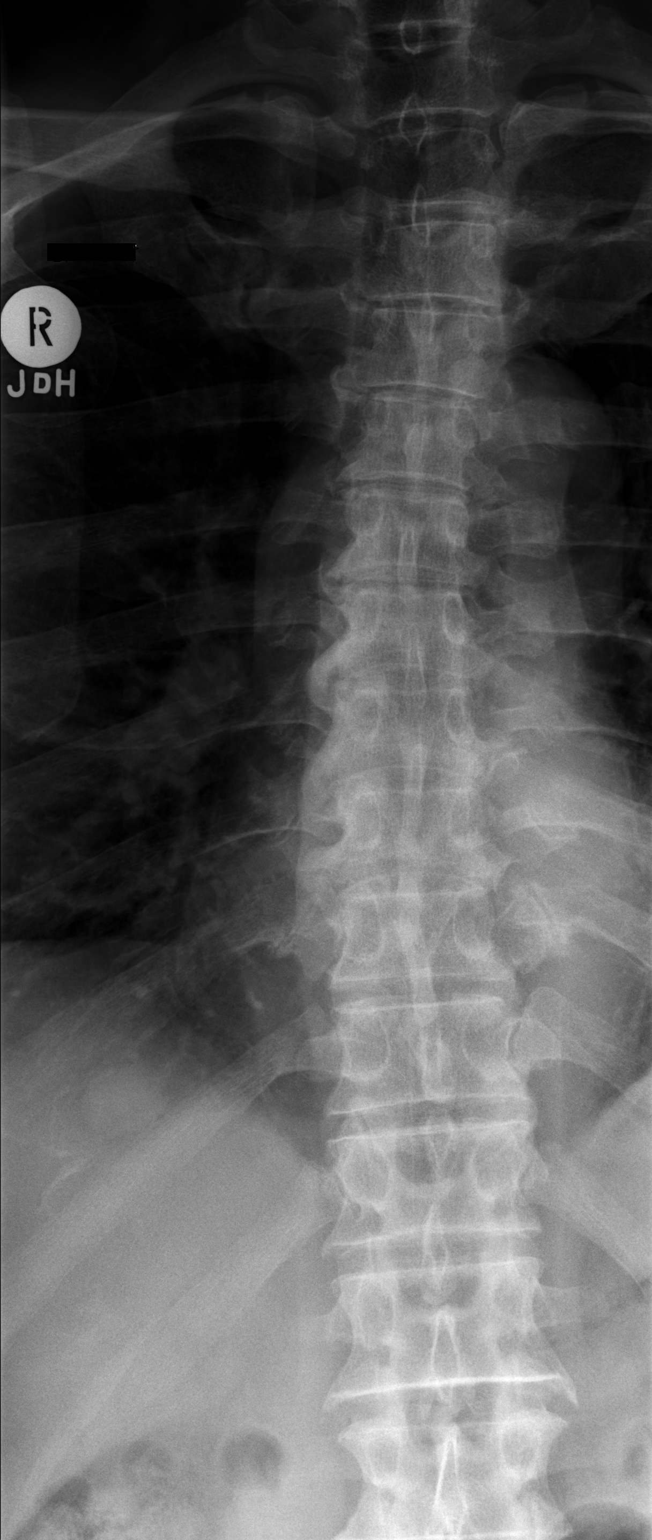

[w thoracic spine lat]
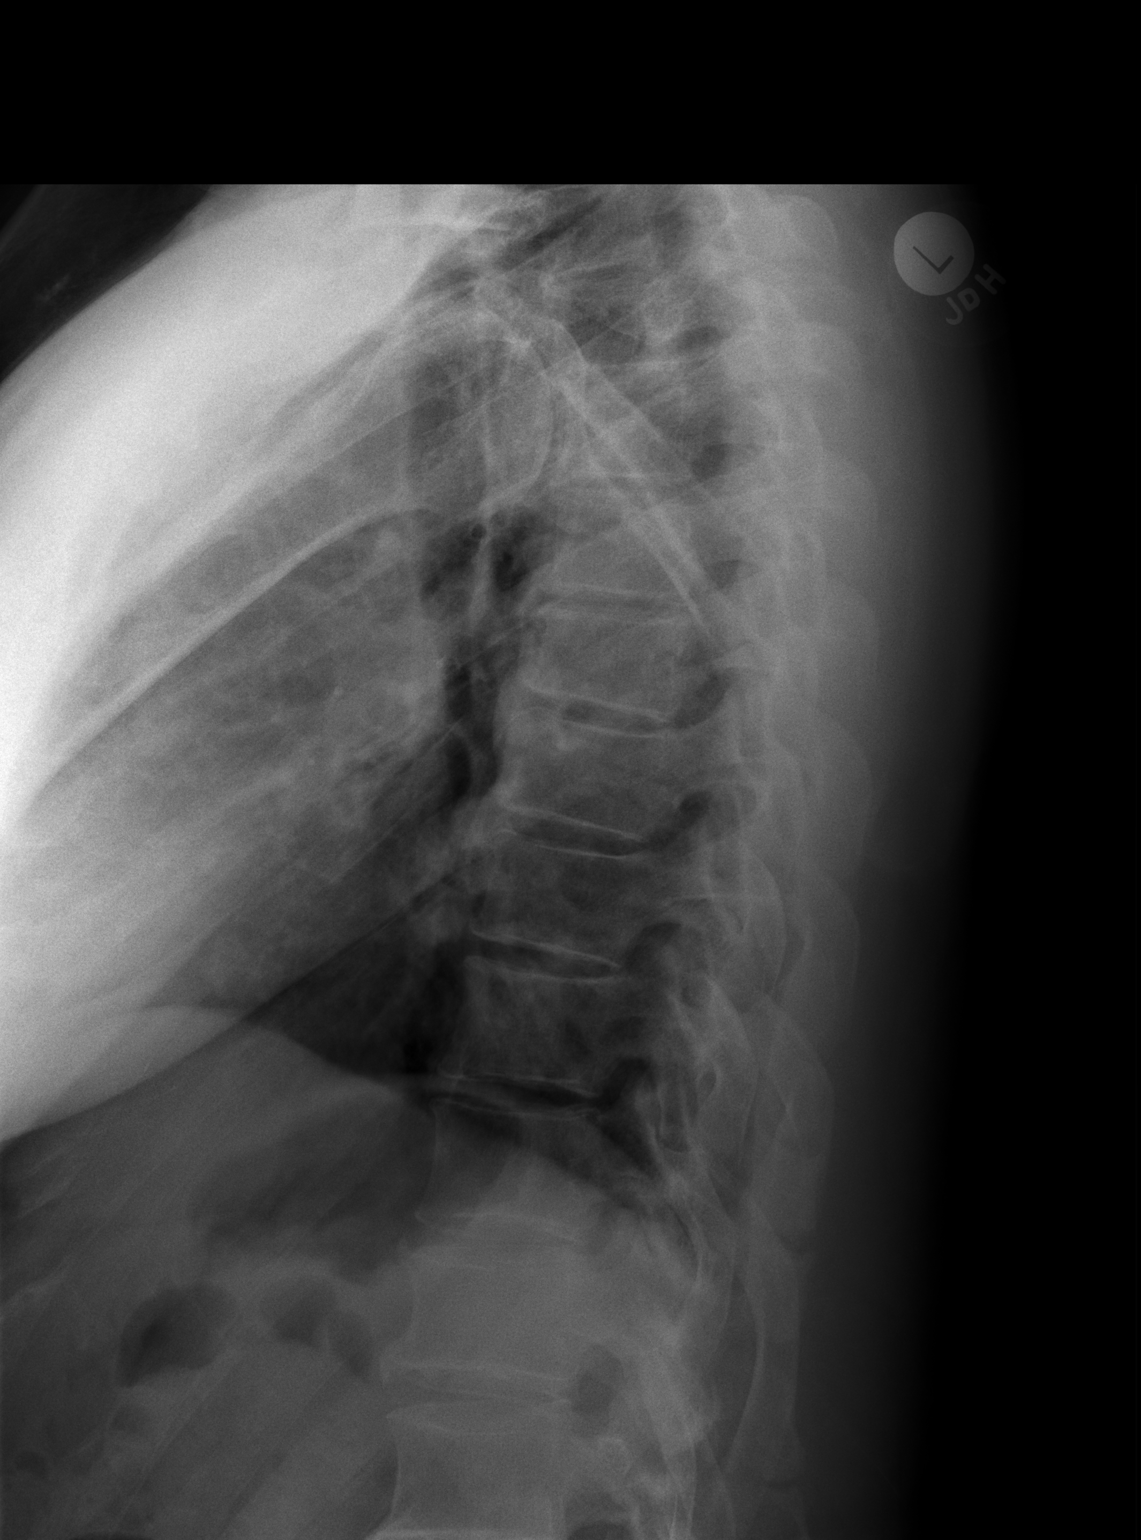

[w thoracic swimmers]
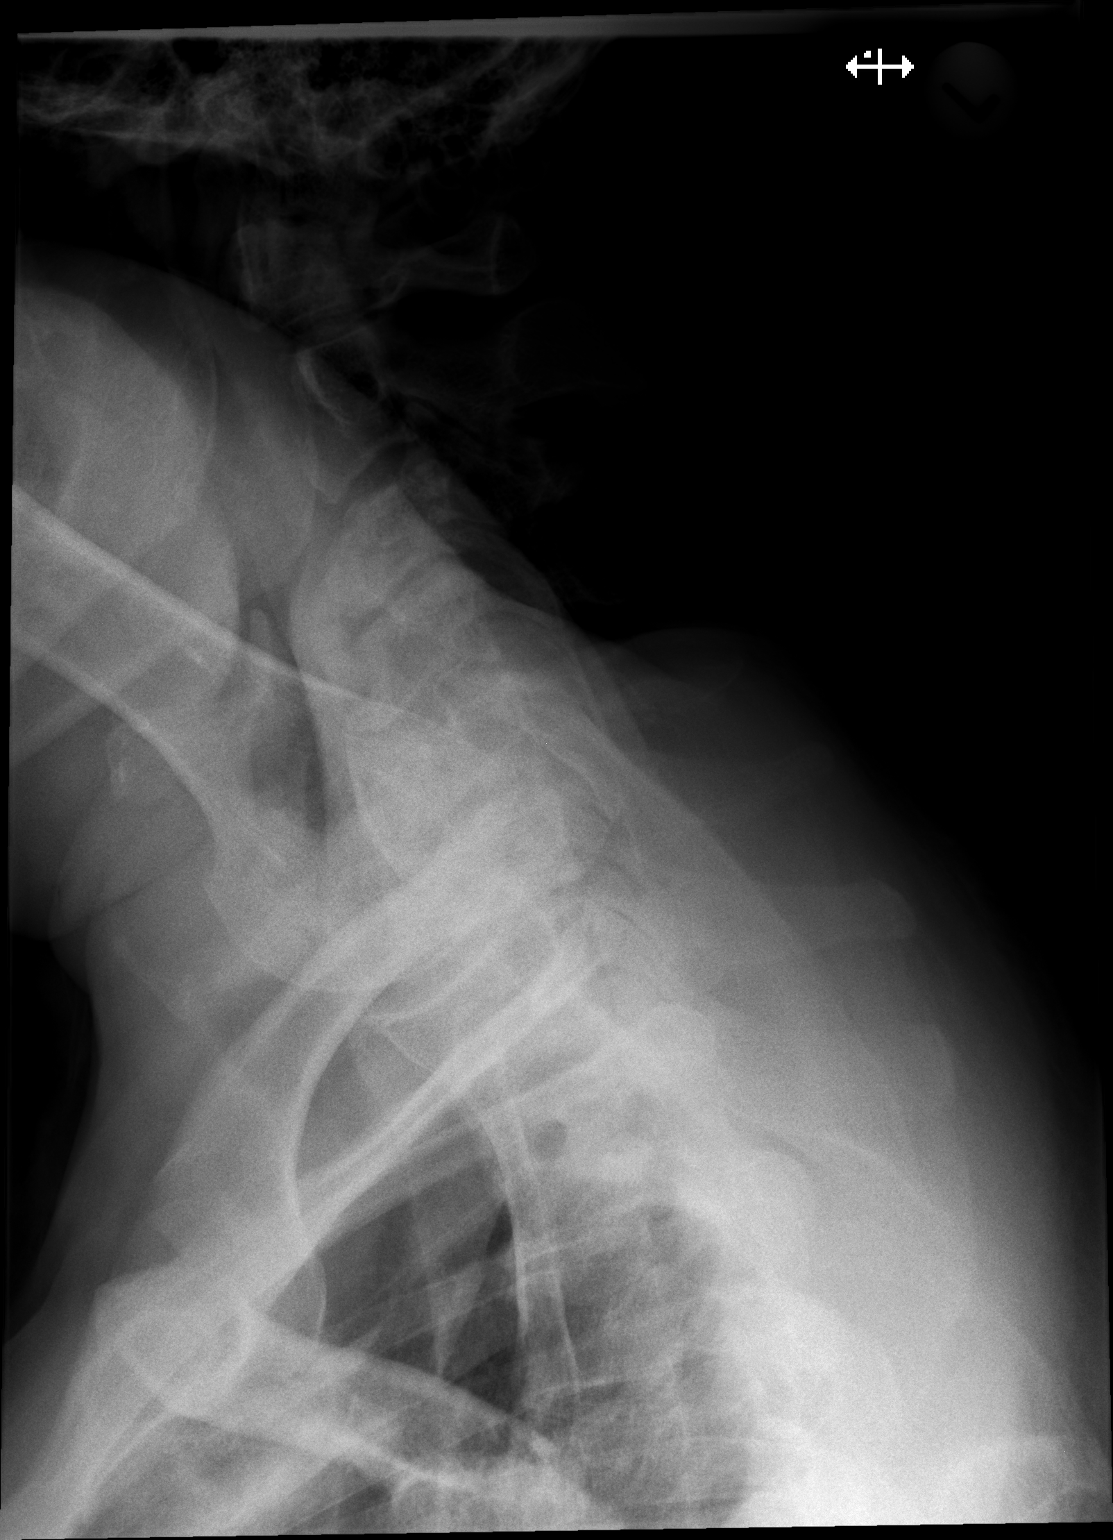

[4 of 4 positions shown; findings below may reference images not displayed]

FINDINGS: Twelve pairs of ribs.

Mild broad-based dextro convex scoliosis.

Multilevel disc space narrowing and endplate spur formation
particularly along the RIGHT lateral aspect of the thoracic spine.

Visualized posterior ribs intact.

No thoracic vertebral fracture or bone destruction.
IMPRESSION: Degenerative disc disease changes thoracic spine with minimal dextro
convex scoliosis.

No acute bony abnormalities.

## 2018-05-05 DIAGNOSIS — F4310 Post-traumatic stress disorder, unspecified: Secondary | ICD-10-CM | POA: Insufficient documentation

## 2018-11-02 ENCOUNTER — Emergency Department (HOSPITAL_COMMUNITY)
Admission: EM | Admit: 2018-11-02 | Discharge: 2018-11-02 | Disposition: A | Discharge status: Home / Self Care | Payer: Self-pay | Attending: Emergency Medicine | Admitting: Emergency Medicine

## 2018-11-02 ENCOUNTER — Other Ambulatory Visit: Payer: Self-pay

## 2018-11-02 ENCOUNTER — Encounter (HOSPITAL_COMMUNITY): Payer: Self-pay | Admitting: *Deleted

## 2018-11-02 DIAGNOSIS — Z87891 Personal history of nicotine dependence: Secondary | ICD-10-CM | POA: Insufficient documentation

## 2018-11-02 DIAGNOSIS — Z79899 Other long term (current) drug therapy: Secondary | ICD-10-CM | POA: Insufficient documentation

## 2018-11-02 DIAGNOSIS — M67472 Ganglion, left ankle and foot: Secondary | ICD-10-CM | POA: Insufficient documentation

## 2018-11-02 HISTORY — DX: Anxiety disorder, unspecified: F41.9

## 2018-11-02 HISTORY — DX: Post-traumatic stress disorder, unspecified: F43.10

## 2018-11-02 HISTORY — DX: Major depressive disorder, single episode, unspecified: F32.9

## 2018-11-02 HISTORY — DX: Depression, unspecified: F32.A

## 2018-11-02 NOTE — ED Provider Notes (Signed)
[place] EMERGENCY DEPARTMENT Provider Note   CSN: [id number] Arrival date & time: 11/02/18  4627    History   Chief Complaint Chief Complaint  Patient presents with  . Foot Pain    HPI Manuel Brady is a 63 y.o. male.     The history is provided by the patient. No language interpreter was used.  Foot Pain      63 year old male with history of arthritis, anxiety, presenting for evaluation of left foot pain.  Patient reporting sharp pain to the aspect of his/ankle over the last 5 days.  Pain is nonradiating but worsening with movement.  Pain is moderate in severity.  He was seen by his PCP 3 days ago for this complaint.  He was given diclofenac, tumeric and Robaxin which he has been taking without adequate relief.  He denies any specific injury.  No fever or rash.  No numbness or tingling sensation.  No history of gout.  Past Medical History:  Diagnosis Date  . Anxiety   . Arthritis    R hip   . Depression   . Hepatitis C DX 2005   partial [facility] study  . PTSD (post-traumatic stress disorder)     Patient Active Problem List   Diagnosis Date Noted  . Pain, dental 08/06/2015  . Cold sore 08/06/2015  . Incidental lung nodule, greater than or equal to 47mm 06/12/2015  . Bilateral renal cysts 06/12/2015  . Liver fibrosis 05/14/2015  . Allergic rhinitis 01/28/2015  . Onychomycosis of toenail 01/28/2015  . BPH (benign prostatic hypertrophy) 01/28/2015  . Chronic hepatitis C without hepatic coma [facility])     Past Surgical History:  Procedure Laterality Date  . arm surgery Left 2003    plates in arm, arm caught in machine, crush injury   . arm surgery    . COLONOSCOPY N/A 03/05/2015   Procedure: COLONOSCOPY;  Surgeon: Danie Binder, MD;  Location[facility];  Service: Endoscopy;  Laterality: N/A;  11:15 AM  . KNEE SURGERY Left 2005         Home Medications    Prior to Admission medications   Medication Sig Start Date End Date Taking?  Authorizing Provider  Elbasvir-Grazoprevir (ZEPATIER) 50-100 MG TABS Take 1 tablet by mouth daily. Patient not taking: Reported on 09/11/2015 04/03/15   Thayer Headings, MD  ibuprofen (ADVIL,MOTRIN) 200 MG tablet Take 2 tablets (400 mg total) by mouth every 6 (six) hours as needed for mild pain. Patient taking differently: Take 800 mg by mouth every 6 (six) hours as needed for mild pain.  01/28/15   Boykin Nearing, MD    Family History Family History  Problem Relation Age of Onset  . Hypertension Mother   . Cancer Father        lung  . Colon cancer Neg Hx     Social History Social History   Tobacco Use  . Smoking status: Former Smoker    Types: Cigarettes    Last attempt to quit: 01/08/2013    Years since quitting: 5.8  . Smokeless tobacco: Never Used  Substance Use Topics  . Alcohol use: No    Alcohol/week: 0.0 standard drinks    Comment: former  . Drug use: No    Types: Cocaine    Comment: Last use two years ago (2014)     Allergies   Patient has no known allergies.   Review of Systems Review of Systems  Constitutional: Negative for fever.  Skin: Negative for rash and wound.     Physical Exam Updated Vital Signs BP (!) 155/105 (BP Location: Right Arm)   Pulse 81   Temp 97.8 F (36.6 C) (Oral)   Resp 18   Ht 6\' 1"  (1.854 m)   Wt 99.8 kg   SpO2 98%   BMI 29.03 kg/m   Physical Exam Vitals signs and nursing note reviewed.  Constitutional:      General: He is not in acute distress.    Appearance: He is well-developed.  HENT:     Head: Atraumatic.  Eyes:     Conjunctiva/sclera: Conjunctivae normal.  Neck:     Musculoskeletal: Neck supple.  Musculoskeletal:        General: Tenderness (Left foot/ankle: A 1 cm mobile subcutaneous nodule noted.  2 lateral malleoli region that is mildly tender to palpation.  No surrounding skin erythema edema or warmth.  Ankle with full range of motion.  No tenderness to sole of foot.  pedal pulses 2+) present.  Skin:     Findings: No rash.  Neurological:     Mental Status: He is alert.      ED Treatments / Results  Labs (all labs ordered are listed, but only abnormal results are displayed) Labs Reviewed - No data to display  EKG None  Radiology No results found.  Procedures Procedures (including critical care time)  Medications Ordered in ED Medications - No data to display   Initial Impression / Assessment and Plan / ED Course  I have reviewed the triage vital signs and the nursing notes.  Pertinent labs & imaging results that were available during my care of the patient were reviewed by me and considered in my medical decision making (see chart for details).        BP (!) 155/105 (BP Location: Right Arm)   Pulse 81   Temp 97.8 F (36.6 C) (Oral)   Resp 18   Ht 6\' 1"  (1.854 m)   Wt 99.8 kg   SpO2 98%   BMI 29.03 kg/m    Final Clinical Impressions(s) / ED Diagnoses   Final diagnoses:  Ganglion cyst of left foot    ED Discharge Orders    None     9:41 AM Pt here with pain to L foot.  He has a mobile subcutaneous nodule inferior to L malleolar, which felt to be a ganglion cyst.  Will give referral to podiatry for further care. Doubt infection or fx.     Domenic Moras, PA-C 11/02/18 4970    Davonna Belling, MD 11/02/18 1524

## 2018-11-02 NOTE — ED Notes (Signed)
Patient verbalizes understanding of discharge instructions . Opportunity for questions and answers were provided . Armband removed by staff ,Pt discharged from ED. W/C  offered at D/C  and Declined W/C at D/C and was escorted to lobby by RN.  

## 2018-11-02 NOTE — ED Triage Notes (Signed)
Pt reports lt foot pain  four  ,4 days.  Pt has tried OTC with out relief

## 2018-11-05 ENCOUNTER — Encounter (HOSPITAL_COMMUNITY): Payer: Self-pay | Admitting: *Deleted

## 2018-11-05 ENCOUNTER — Emergency Department (HOSPITAL_COMMUNITY)
Admission: EM | Admit: 2018-11-05 | Discharge: 2018-11-05 | Disposition: A | Discharge status: Home / Self Care | Payer: Self-pay | Attending: Emergency Medicine | Admitting: Emergency Medicine

## 2018-11-05 ENCOUNTER — Other Ambulatory Visit: Payer: Self-pay

## 2018-11-05 DIAGNOSIS — Y999 Unspecified external cause status: Secondary | ICD-10-CM | POA: Insufficient documentation

## 2018-11-05 DIAGNOSIS — Y929 Unspecified place or not applicable: Secondary | ICD-10-CM | POA: Insufficient documentation

## 2018-11-05 DIAGNOSIS — S39012A Strain of muscle, fascia and tendon of lower back, initial encounter: Secondary | ICD-10-CM | POA: Insufficient documentation

## 2018-11-05 DIAGNOSIS — Y939 Activity, unspecified: Secondary | ICD-10-CM | POA: Insufficient documentation

## 2018-11-05 DIAGNOSIS — Z79899 Other long term (current) drug therapy: Secondary | ICD-10-CM | POA: Insufficient documentation

## 2018-11-05 DIAGNOSIS — X58XXXA Exposure to other specified factors, initial encounter: Secondary | ICD-10-CM | POA: Insufficient documentation

## 2018-11-05 DIAGNOSIS — M5416 Radiculopathy, lumbar region: Secondary | ICD-10-CM

## 2018-11-05 DIAGNOSIS — Z87891 Personal history of nicotine dependence: Secondary | ICD-10-CM | POA: Insufficient documentation

## 2018-11-05 MED ORDER — LIDOCAINE 5 % EX PTCH: 1.0000 | MEDICATED_PATCH | CUTANEOUS | 0 refills | Status: DC

## 2018-11-05 MED ORDER — LIDOCAINE 5 % EX PTCH
1.0000 | MEDICATED_PATCH | CUTANEOUS | Status: DC
  Administered 2018-11-05: 1 via TRANSDERMAL
  Filled 2018-11-05: qty 1

## 2018-11-05 MED ORDER — METHYLPREDNISOLONE ACETATE 80 MG/ML IJ SUSP
80.0000 mg | Freq: Once | INTRAMUSCULAR | Status: AC
  Administered 2018-11-05: 80 mg via INTRAMUSCULAR
  Filled 2018-11-05: qty 1

## 2018-11-05 NOTE — ED Provider Notes (Signed)
[place] EMERGENCY DEPARTMENT Provider Note   CSN: [id number] Arrival date & time: 11/05/18  3710    History   Chief Complaint Chief Complaint  Patient presents with  . Back Pain    HPI Manuel Brady is a 63 y.o. male.     63yo male presents with complaint of left lower back pain which radiates down his left leg to his foot.  Patient states that he was seen at his PCP office earlier in the week with pain in his left buttocks and diagnosed with sciatica, given Robaxin, diclofenac, tumeric.  Patient was seen in the emergency room 3 days ago with pain in his left foot and diagnosed with ganglion cyst in the foot with referral to podiatry.  Patient is taking his medications without improvement and now states that yesterday he lifted something heavy and has pain in his left lower back rating down the lateral leg into his foot.  Denies abdominal pain, changes in bowel or bladder habits, groin numbness.  Patient states he was unable to sleep last night due to the pain which prompted his visit today.     Past Medical History:  Diagnosis Date  . Anxiety   . Arthritis    R hip   . Depression   . Hepatitis C DX 2005   partial [facility] study  . PTSD (post-traumatic stress disorder)     Patient Active Problem List   Diagnosis Date Noted  . Pain, dental 08/06/2015  . Cold sore 08/06/2015  . Incidental lung nodule, greater than or equal to 50mm 06/12/2015  . Bilateral renal cysts 06/12/2015  . Liver fibrosis 05/14/2015  . Allergic rhinitis 01/28/2015  . Onychomycosis of toenail 01/28/2015  . BPH (benign prostatic hypertrophy) 01/28/2015  . Chronic hepatitis C without hepatic coma [facility])     Past Surgical History:  Procedure Laterality Date  . arm surgery Left 2003    plates in arm, arm caught in machine, crush injury   . arm surgery    . COLONOSCOPY N/A 03/05/2015   Procedure: COLONOSCOPY;  Surgeon: Danie Binder, MD;  Location: [facility];  Service:  Endoscopy;  Laterality: N/A;  11:15 AM  . KNEE SURGERY Left 2005         Home Medications    Prior to Admission medications   Medication Sig Start Date End Date Taking? Authorizing Provider  Elbasvir-Grazoprevir (ZEPATIER) 50-100 MG TABS Take 1 tablet by mouth daily. Patient not taking: Reported on 09/11/2015 04/03/15   Thayer Headings, MD  ibuprofen (ADVIL,MOTRIN) 200 MG tablet Take 2 tablets (400 mg total) by mouth every 6 (six) hours as needed for mild pain. Patient taking differently: Take 800 mg by mouth every 6 (six) hours as needed for mild pain.  01/28/15   Funches, Adriana Mccallum, MD  lidocaine (LIDODERM) 5 % Place 1 patch onto the skin daily. Remove & Discard patch within 12 hours or as directed by MD 11/05/18   Tacy Learn, PA-C    Family History Family History  Problem Relation Age of Onset  . Hypertension Mother   . Cancer Father        lung  . Colon cancer Neg Hx     Social History Social History   Tobacco Use  . Smoking status: Former Smoker    Types: Cigarettes    Last attempt to quit: 01/08/2013    Years since quitting: 5.8  . Smokeless tobacco: Never Used  Substance Use Topics  .  Alcohol use: No    Alcohol/week: 0.0 standard drinks    Comment: former  . Drug use: No    Types: Cocaine    Comment: Last use two years ago (2014)     Allergies   Patient has no known allergies.   Review of Systems Review of Systems  Constitutional: Negative for fever.  Gastrointestinal: Negative for abdominal pain, constipation and diarrhea.  Genitourinary: Negative for decreased urine volume and difficulty urinating.  Musculoskeletal: Positive for back pain and myalgias.  Skin: Negative for rash and wound.  Allergic/Immunologic: Negative for immunocompromised state.  Neurological: Negative for weakness and numbness.  Psychiatric/Behavioral: Negative for confusion.  All other systems reviewed and are negative.    Physical Exam Updated Vital Signs BP (!) 145/99 (BP  Location: Right Arm)   Pulse 75   Temp 97.9 F (36.6 C) (Oral)   Resp 20   Ht 6\' 1"  (1.854 m)   Wt 99.8 kg   SpO2 100%   BMI 29.03 kg/m   Physical Exam Vitals signs and nursing note reviewed.  Constitutional:      General: He is not in acute distress.    Appearance: He is well-developed. He is not diaphoretic.  HENT:     Head: Normocephalic and atraumatic.  Cardiovascular:     Pulses: Normal pulses.  Pulmonary:     Effort: Pulmonary effort is normal.  Abdominal:     Palpations: Abdomen is soft.     Tenderness: There is no abdominal tenderness.  Musculoskeletal:        General: Tenderness present.     Lumbar back: He exhibits tenderness. He exhibits normal range of motion, no bony tenderness, no swelling, no edema and no deformity.       Back:     Right lower leg: No edema.     Left lower leg: No edema.  Skin:    General: Skin is warm and dry.     Findings: No erythema or rash.  Neurological:     Mental Status: He is alert and oriented to person, place, and time.     GCS: GCS eye subscore is 4. GCS verbal subscore is 5. GCS motor subscore is 6.     Sensory: No sensory deficit.     Motor: No weakness.     Gait: Gait normal.     Deep Tendon Reflexes: Reflexes normal.     Reflex Scores:      Patellar reflexes are 1+ on the right side and 1+ on the left side.    Comments: Straight leg raise positive for pain on left with left leg raise   Psychiatric:        Behavior: Behavior normal.      ED Treatments / Results  Labs (all labs ordered are listed, but only abnormal results are displayed) Labs Reviewed - No data to display  EKG None  Radiology No results found.  Procedures Procedures (including critical care time)  Medications Ordered in ED Medications  lidocaine (LIDODERM) 5 % 1 patch (1 patch Transdermal Patch Applied 11/05/18 0912)  methylPREDNISolone acetate (DEPO-MEDROL) injection 80 mg (80 mg Intramuscular Given 11/05/18 0912)     Initial  Impression / Assessment and Plan / ED Course  I have reviewed the triage vital signs and the nursing notes.  Pertinent labs & imaging results that were available during my care of the patient were reviewed by me and considered in my medical decision making (see chart for details).  Clinical Course as  of Nov 04 1105  Sat Mar 28, 332  [id number] 63 year old male presents with complaint of left lower back pain which radiates down his left leg after lifting yesterday.  Patient is concerned he may have pulled a muscle in his back and would like an x-ray for this.  On exam patient has left paraspinous tenderness, no midline or bony tenderness, no red flag symptoms.  Patient is already taking muscle relaxer, anti-inflammatory as prescribed by PCP.  Recommend he apply warm compresses, light activity, given Lidoderm patch and injection of Depo-Medrol while in the ER.  Upon further discussion regarding imaging, patient declines x-ray as this will not show the pulled muscle he suspects he has.  Patient was advised to follow-up with his PCP, may need physical therapy.  Return to ER for worsening or concerning symptoms.  Patient verbalizes understanding of discharge instructions and plan.   [LM]    Clinical Course User Index [LM] Tacy Learn, PA-C   Final Clinical Impressions(s) / ED Diagnoses   Final diagnoses:  Strain of lumbar region, initial encounter  Lumbar radiculopathy    ED Discharge Orders         Ordered    lidocaine (LIDODERM) 5 %  Every 24 hours     11/05/18 0923           Tacy Learn, PA-C 11/05/18 1108    Pattricia Boss, MD 11/05/18 [id number]

## 2018-11-05 NOTE — ED Notes (Signed)
"  Patient verbalizes understanding of discharge instructions. Opportunity for questioning and answers were provided.  pt discharged from ED ."  

## 2018-11-05 NOTE — ED Triage Notes (Signed)
PT seen a few days ago for same. Pt reports back pain that radiates  Into Lt leg. Pt wants X-ray.

## 2018-11-05 NOTE — Discharge Instructions (Addendum)
Light activity at home today. Apply lidoderm patches as prescribed. If these are too expensive, ask pharmacy for OTC lidoderm patch recommendations.  Take medications as previously prescribed.  Apply warm compresses for 20-30 minutes then do stretches from your doctor. Follow up with your doctor, you may need a referral to physical therapy. Return to ER for worsening symptoms, or call your doctor for guidance prior to coming to the ER.

## 2019-01-17 ENCOUNTER — Other Ambulatory Visit: Payer: Self-pay | Admitting: *Deleted

## 2019-01-17 DIAGNOSIS — Z20822 Contact with and (suspected) exposure to covid-19: Secondary | ICD-10-CM

## 2019-01-17 NOTE — Progress Notes (Signed)
lab

## 2019-01-19 ENCOUNTER — Telehealth: Payer: Self-pay

## 2019-01-19 LAB — NOVEL CORONAVIRUS, NAA: SARS-CoV-2, NAA: NOT DETECTED

## 2019-01-19 NOTE — Telephone Encounter (Signed)
Patient called for covid test results, results given as noted, not detected, which means negative and he was not infected with the novel coronavirus. Patient verbalized understanding and says he feels good now that he knows the results.

## 2019-02-08 ENCOUNTER — Other Ambulatory Visit: Payer: Self-pay

## 2019-02-08 ENCOUNTER — Ambulatory Visit: Payer: Self-pay | Attending: Family Medicine | Admitting: Family Medicine

## 2019-02-08 ENCOUNTER — Encounter: Payer: Self-pay | Admitting: Family Medicine

## 2019-02-08 VITALS — BP 162/106 | HR 91 | Temp 98.6°F | Ht 73.0 in | Wt 239.6 lb

## 2019-02-08 DIAGNOSIS — M25572 Pain in left ankle and joints of left foot: Secondary | ICD-10-CM

## 2019-02-08 DIAGNOSIS — R030 Elevated blood-pressure reading, without diagnosis of hypertension: Secondary | ICD-10-CM

## 2019-02-08 MED ORDER — IBUPROFEN 600 MG PO TABS: 600.0000 mg | ORAL_TABLET | Freq: Three times a day (TID) | ORAL | 0 refills | Status: DC | PRN

## 2019-02-08 MED FILL — IBUPROFEN 600 MG TABLET: 600 | 20 days supply | Qty: 60 | Fill #0

## 2019-02-08 NOTE — Progress Notes (Signed)
New Patient Office Visit  Subjective:  Patient ID: Manuel Brady, male    DOB: 1955/10/25  Age: 63 y.o. MRN: [id number]  CC: cyst on the left ankle  HPI Manuel Brady presents to establish care as a new patient and he reports that he has a painful cyst/swelling on his left ankle.  He reports that he was told that he has a ganglion cyst on his left ankle.  He reports that the prior medications, diclofenac, tumor and Robaxin which he was told to take for the pain in his ankle have not been effective.  Pain is a 7 or higher when he is walking.  When sitting still he sometimes has no pain or pain that is dull in about a 3-4 on a 0-10 scale.  He states that he was told that he might be able to obtain an orange card so that he could see a specialist if he came to this office. He has been taking over-the-counter ibuprofen anywhere from 200 to 400 mg which sometimes helps with the pain.  He denies any abdominal pain, no blood in the stool, no dark/sticky stools.      Patient's blood pressure is elevated at today's visit and he denies any prior history of hypertension.  He denies any headaches or dizziness.  He is not really interested in being on medication for high blood pressure if he can otherwise avoid doing so.  Past Medical History:  Diagnosis Date  . Anxiety   . Arthritis    R hip   . Depression   . Hepatitis C DX 2005   partial [facility] study  . PTSD (post-traumatic stress disorder)     Past Surgical History:  Procedure Laterality Date  . arm surgery Left 2003    plates in arm, arm caught in machine, crush injury   . arm surgery    . COLONOSCOPY N/A 03/05/2015   Procedure: COLONOSCOPY;  Surgeon: Danie Binder, MD;  Location[facility];  Service: Endoscopy;  Laterality: N/A;  11:15 AM  . KNEE SURGERY Left 2005     Family History  Problem Relation Age of Onset  . Hypertension Mother   . Cancer Father        lung  . Colon cancer Neg Hx     Social History   Tobacco Use    . Smoking status: Former Smoker    Types: Cigarettes    Quit date: 01/08/2013    Years since quitting: 6.0  . Smokeless tobacco: Never Used  Substance Use Topics  . Alcohol use: No    Alcohol/week: 0.0 standard drinks    Comment: former  . Drug use: No    Types: Cocaine    Comment: Last use two years ago (2014)    ROS Review of Systems  Constitutional: Positive for fatigue (Occasional). Negative for chills and fever.  HENT: Negative for sore throat and trouble swallowing.   Respiratory: Negative for cough and shortness of breath.   Cardiovascular: Negative for chest pain and palpitations.  Gastrointestinal: Negative for abdominal pain, blood in stool, constipation, diarrhea and nausea.  Endocrine: Negative for polydipsia, polyphagia and polyuria.  Genitourinary: Negative for dysuria and frequency.  Musculoskeletal: Positive for arthralgias and gait problem.  Neurological: Negative for dizziness and headaches.  Hematological: Negative for adenopathy. Does not bruise/bleed easily.    Objective:   Today's Vitals: BP (!) 162/106 (BP Location: Left Arm, Patient Position: Sitting, Cuff Size: Large)   Pulse 91  Temp 98.6 F (37 C) (Oral)   Ht 6\' 1"  (1.854 m)   Wt 239 lb 9.6 oz (108.7 kg)   SpO2 94%   BMI 31.61 kg/m   Physical Exam Vitals signs and nursing note reviewed.  Constitutional:      Appearance: Normal appearance. He is obese.     Comments: Well-nourished well-developed overweight/obese larger framed male in no acute distress sitting on exam table  Cardiovascular:     Rate and Rhythm: Normal rate and regular rhythm.  Pulmonary:     Effort: Pulmonary effort is normal.     Breath sounds: Normal breath sounds.  Abdominal:     Palpations: Abdomen is soft.     Tenderness: There is no abdominal tenderness. There is no right CVA tenderness, left CVA tenderness, guarding or rebound.  Musculoskeletal:        General: Tenderness present. No swelling.     Right lower  leg: No edema.     Left lower leg: No edema.     Comments: No visible swelling of the ankle at today's visit but patient with some tenderness to palpation over the left lateral malleolus of the ankle and just in front of the lateral malleolus  Skin:    General: Skin is warm and dry.     Comments: Onychomycosis of the left great toenail-nail is thickened and discolored  Neurological:     General: No focal deficit present.     Mental Status: He is alert and oriented to person, place, and time.  Psychiatric:        Mood and Affect: Mood normal.        Behavior: Behavior normal.     Assessment & Plan:  1. Acute left ankle pain Patient with complaint of acute left ankle pain for which he states he was told in the past that this might represent a ganglion cyst.  Patient is encouraged to apply for the financial assistance program through this clinic.  He will be referred to podiatry for further evaluation and treatment.  He may also wish to obtain an over-the-counter ankle brace/ankle support.  Prescription for ibuprofen 600 mg to take after eating no more than every 8 hours as needed for pain. - Ambulatory referral to Podiatry - ibuprofen (ADVIL) 600 MG tablet; Take 1 tablet (600 mg total) by mouth every 8 (eight) hours as needed for moderate pain. Take after eating  Dispense: 60 tablet; Refill: 0  2. Elevated blood pressure reading Patient with elevated blood pressure today's visit.  Patient states that he has never been diagnosed with high blood pressure in the past.  He does not wish to be on medication at this time.  Information provided on DASH diet and patient will have blood pressure follow-up in 4 to 6 weeks.  Also discussed with the patient that decongestant type medications can cause elevations in blood pressure and it is also possible that his current ibuprofen for pain may also be a contributing factor.  Outpatient Encounter Medications as of 02/08/2019  Medication Sig  .  Elbasvir-Grazoprevir (ZEPATIER) 50-100 MG TABS Take 1 tablet by mouth daily. (Patient not taking: Reported on 09/11/2015)  . ibuprofen (ADVIL,MOTRIN) 200 MG tablet Take 2 tablets (400 mg total) by mouth every 6 (six) hours as needed for mild pain. (Patient taking differently: Take 800 mg by mouth every 6 (six) hours as needed for mild pain. )  . lidocaine (LIDODERM) 5 % Place 1 patch onto the skin daily. Remove & Discard patch  within 12 hours or as directed by MD   No facility-administered encounter medications on file as of 02/08/2019.    An After Visit Summary was printed and given to the patient.  Follow-up: Return in about 6 weeks (around 03/22/2019) for blood pressure f/u in 4-6 weeks.  Antony Blackbird, MD

## 2019-02-08 NOTE — Progress Notes (Signed)
Per pt he is here today for left ankle cyst pain  Per pt she stopped taking his diclofenac due to it not working any more.  Per pt he also stopped his tumeric

## 2019-02-08 NOTE — Patient Instructions (Signed)

## 2019-02-15 ENCOUNTER — Ambulatory Visit: Payer: Self-pay | Admitting: Orthopaedic Surgery

## 2019-02-17 ENCOUNTER — Ambulatory Visit: Payer: Self-pay | Admitting: Orthopaedic Surgery

## 2019-02-20 ENCOUNTER — Other Ambulatory Visit: Payer: Self-pay

## 2019-02-20 ENCOUNTER — Ambulatory Visit: Payer: Self-pay | Admitting: Orthopaedic Surgery

## 2019-02-20 ENCOUNTER — Ambulatory Visit: Payer: Self-pay | Admitting: Podiatry

## 2019-02-21 ENCOUNTER — Encounter: Payer: Self-pay | Admitting: Orthopaedic Surgery

## 2019-02-21 ENCOUNTER — Ambulatory Visit (INDEPENDENT_AMBULATORY_CARE_PROVIDER_SITE_OTHER): Payer: Worker's Compensation | Admitting: Orthopaedic Surgery

## 2019-02-21 ENCOUNTER — Other Ambulatory Visit: Payer: Self-pay

## 2019-02-21 VITALS — BP 152/100 | HR 83 | Ht 76.0 in | Wt 234.0 lb

## 2019-02-21 DIAGNOSIS — M5442 Lumbago with sciatica, left side: Secondary | ICD-10-CM

## 2019-02-21 NOTE — Progress Notes (Signed)
Office Visit Note   Patient: Manuel Brady           Date of Birth: [birth year]           MRN: [id number] Visit Date: 02/21/2019              Requested by: Elbert Ewings, [address] [place],  [place] [address] PCP: Elbert Ewings, FNP   Assessment & Plan: Visit Diagnoses:  1. Acute left-sided low back pain with left-sided sciatica     Plan: [name] relates that he sustained an on-the-job injury on October 27, 2018.  At the time he injured his back but was able to function for several days until he visited an urgent care.  He was diagnosed with a back sprain.  He had another injury on the job with exacerbation of his pain and eventually was seen in the [facility] emergency room with follow-up with [facility].  An MRI scan was performed on 12/06/2018 demonstrating a left paracentral and foraminal disc protrusion that contacts the traversing left S1 nerve root and may cause radicular symptoms.  There was also mild left foraminal stenosis.  To date he is not had any specific treatment other than Advil and 1 day of physical therapy.  He has not worked since November 09, 2018.  After reviewing his records and the MRI scan I think the next best approach would be an epidural steroid injection.  We had a long discussion regarding his diagnosis and treatment options.  He eventually might be a candidate for surgical decompression but I certainly think that we would start with the epidural steroid injection.  I think he has significant issues with his lumbar spine as evidenced by the MRI scan and should avoid bending stooping squatting, twisting and lifting.  If the epidural steroid injection is unsuccessful I would refer him to a spine surgeon to consider surgical decompression  Follow-Up Instructions: Return if symptoms worsen or fail to improve.   Orders:  No orders of the defined types were placed in this encounter.  No orders of the defined types were placed in this  encounter.     Procedures: No procedures performed   Clinical Data: No additional findings.   Subjective: Chief Complaint  Patient presents with   Lower Back - Pain  Patient presents today for his lower back. He is a second opinion with Workers Comp. He injured his back on March 19th 2020. He was pushing a trash can up a hill and felt something pull. He waited a few days and then had it evaluated by Dr.Steele on the 23rd of March. He reinjured it again on March 25th by lifting a trash can. We went to the ER at [facility], no imaging done. He was given a prednisone injection and Lidoderm patch. He then followed up with [facility] and was written out of work. He had an MRI with [facility] on 12/06/2018. He has pain that radiates down his left leg and into his toes. He has numbness and tingling in his left lower extremity. He has more pain when lying on his left side. He is taking Ibuprofen and still out of work. He brought a disc with his MRI on it. He has tried PT, but was only able to complete one session. Mr. [name] relates that he has had problems with his back in the past and even had an MRI scan apparently in 2014.  The findings on his recent MRI scan are different.  He was asymptomatic prior to this particular injury. He has not worked since November 09, 2018 as he works for the [employer].  His job requires bending stooping squatting and lifting.  He is not had any bowel or bladder dysfunction.  He does take ibuprofen that seems to help.  He also has been to the podiatrist regarding a cyst in his left foot that is not related to the issue with his back. He is represented by the [facility] law group who is helping him with his Workmen's Comp. claim.  He has not had any active treatment other than 1 day of physical therapy and using the ibuprofen.  He does not believe he can return to work in his present state because of the nature of his work that involves lifting pushing and  pulling.  These activities have aggravated his back and left leg pain. He relates he has had back injuries in the past but nothing this severe nor that lasted this long.  He is not aware that he had such significant pain in his left leg in the past.  No related right leg pain. I have reviewed records referred to me  HPI  Review of Systems  Constitutional: Negative for fatigue.  HENT: Negative for ear pain.   Eyes: Negative for pain.  Respiratory: Negative for shortness of breath.   Cardiovascular: Negative for leg swelling.  Gastrointestinal: Negative for constipation and diarrhea.  Endocrine: Negative for cold intolerance and heat intolerance.  Genitourinary: Negative for difficulty urinating.  Musculoskeletal: Negative for joint swelling.  Skin: Negative for rash.  Allergic/Immunologic: Negative for food allergies.  Neurological: Positive for weakness.  Hematological: Does not bruise/bleed easily.  Psychiatric/Behavioral: Positive for sleep disturbance.     Objective: Vital Signs: BP (!) 152/100    Pulse 83    Ht 6\' 4"  (1.93 m)    Wt 234 lb (106.1 kg)    BMI 28.48 kg/m   Physical Exam Constitutional:      Appearance: He is well-developed.  Eyes:     Pupils: Pupils are equal, round, and reactive to light.  Pulmonary:     Effort: Pulmonary effort is normal.  Skin:    General: Skin is warm and dry.  Neurological:     Mental Status: He is alert and oriented to person, place, and time.  Psychiatric:        Behavior: Behavior normal.      Ortho Exam awake alert and oriented x3.  Comfortable sitting.  Straight leg raise was negative on the right but positive on the left for back and left thigh pain at about 80 degrees.  I could take the leg to 90 degrees but was uncomfortable.  Reflexes were depressed bilaterally but symmetrical.  Motor and sensory exam appeared to be intact.  He walks without a limp.  No percussible tenderness of his lumbar spine or left paralumbar region.   Painless range of motion of both hips and both knees  Specialty Comments:  No specialty comments available.  Imaging: No results found.   PMFS History: Patient Active Problem List   Diagnosis Date Noted   Pain, dental 08/06/2015   Cold sore 08/06/2015   Incidental lung nodule, greater than or equal to 44mm 06/12/2015   Bilateral renal cysts 06/12/2015   Liver fibrosis 05/14/2015   Allergic rhinitis 01/28/2015   Onychomycosis of toenail 01/28/2015   BPH (benign prostatic hypertrophy) 01/28/2015  Chronic hepatitis C without hepatic coma (HCC)    Past Medical History:  Diagnosis Date   Anxiety    Arthritis    R hip    Depression    Hepatitis C DX 2005   partial [facility] study   PTSD (post-traumatic stress disorder)     Family History  Problem Relation Age of Onset   Hypertension Mother    Cancer Father        lung   Colon cancer Neg Hx     Past Surgical History:  Procedure Laterality Date   arm surgery Left 2003    plates in arm, arm caught in machine, crush injury    arm surgery     COLONOSCOPY N/A 03/05/2015   Procedure: COLONOSCOPY;  Surgeon: Danie Binder, MD;  [facility];  Service: Endoscopy;  Laterality: N/A;  11:15 AM   KNEE SURGERY Left 2005    Social History   Occupational History   Occupation: [employer]   Tobacco Use   Smoking status: Former Smoker    Types: Cigarettes    Quit date: 01/08/2013    Years since quitting: 6.1   Smokeless tobacco: Never Used  Substance and Sexual Activity   Alcohol use: No    Alcohol/week: 0.0 standard drinks    Comment: former   Drug use: No    Types: Cocaine    Comment: Last use two years ago (2014)   Sexual activity: Not Currently    Birth control/protection: None

## 2019-02-27 ENCOUNTER — Ambulatory Visit: Payer: Self-pay

## 2019-02-28 ENCOUNTER — Telehealth: Payer: Self-pay | Admitting: Nurse Practitioner

## 2019-02-28 ENCOUNTER — Ambulatory Visit: Payer: Self-pay

## 2019-02-28 NOTE — Telephone Encounter (Signed)
Pt would like to be called, he has questions concerning his financial paperwork

## 2019-02-28 NOTE — Telephone Encounter (Signed)
LVM returning Pt msg

## 2019-03-01 ENCOUNTER — Ambulatory Visit: Payer: Self-pay | Admitting: Podiatry

## 2019-03-03 ENCOUNTER — Ambulatory Visit: Payer: Self-pay | Admitting: Podiatry

## 2019-03-10 ENCOUNTER — Ambulatory Visit: Payer: Self-pay

## 2019-03-16 ENCOUNTER — Ambulatory Visit: Payer: Self-pay | Attending: Family Medicine

## 2019-03-16 ENCOUNTER — Other Ambulatory Visit: Payer: Self-pay

## 2019-03-16 ENCOUNTER — Ambulatory Visit: Payer: Self-pay

## 2019-03-17 ENCOUNTER — Ambulatory Visit: Payer: Self-pay

## 2019-03-17 ENCOUNTER — Other Ambulatory Visit: Payer: Self-pay

## 2019-03-17 DIAGNOSIS — Z20822 Contact with and (suspected) exposure to covid-19: Secondary | ICD-10-CM

## 2019-03-18 LAB — NOVEL CORONAVIRUS, NAA: SARS-CoV-2, NAA: NOT DETECTED

## 2019-03-23 ENCOUNTER — Ambulatory Visit: Payer: Self-pay | Admitting: Podiatry

## 2019-03-30 ENCOUNTER — Telehealth: Payer: Self-pay | Admitting: Family Medicine

## 2019-03-30 NOTE — Telephone Encounter (Signed)
Pt was sent a letter from financial dept. Inform them, that the application they submitted was incomplete, since they were missing some documentation at the time of the appointment, Pt need to reschedule and resubmit all new papers and application for CAFA and OC, P.S. old documents has been sent back to Pt and need to make a new appt °

## 2019-03-30 NOTE — Telephone Encounter (Signed)
Pt called to let me know that he has almost all the documents he need, I informed him that today is the due date for the documents and he want to to mail or pick up the documents, he said he will pick up and will call me when he has all the documents he need, The paper will be at the [facility]

## 2019-04-20 ENCOUNTER — Other Ambulatory Visit: Payer: Self-pay | Admitting: Critical Care Medicine

## 2019-04-20 ENCOUNTER — Encounter: Payer: Self-pay | Admitting: Critical Care Medicine

## 2019-04-20 DIAGNOSIS — M25572 Pain in left ankle and joints of left foot: Secondary | ICD-10-CM

## 2019-04-20 DIAGNOSIS — B351 Tinea unguium: Secondary | ICD-10-CM

## 2019-04-20 MED ORDER — IBUPROFEN 600 MG PO TABS: 600.0000 mg | ORAL_TABLET | Freq: Three times a day (TID) | ORAL | 0 refills | Status: DC | PRN

## 2019-04-20 MED ORDER — LISINOPRIL-HYDROCHLOROTHIAZIDE 10-12.5 MG PO TABS: 1.0000 | ORAL_TABLET | Freq: Every day | ORAL | 3 refills | Status: DC

## 2019-04-20 NOTE — Progress Notes (Signed)
Patient ID: Manuel Brady, male   DOB: [birth year], 63 y.o.   MRN: [id number]  This is a 63 year old male seen today in the [facility].  The patient has been here since March 2020.  [facility] Chapman Fitch was established as his primary care.  He comes in today complaining of left ankle pain and the need to have this further evaluated.  He does have a history of hypertension and is on lisinopril 10/12 and a half 1 daily.  He received this medication from [facility] [place].  He formerly had hepatitis C with liver disease but is off current medications for hepatitis C.  History of benign prostatic hypertrophy as well.  No history of diabetes.  He does have history of PTSD and is on the Zyprexa.  He is followed by [facility] [place] for this condition as well.  The patient states ibuprofen works well for the left ankle pain and is not interested in further evaluation of this.  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  Blood pressure today is 136/74 chest was clear cardiac exam was regular rate and rhythm normal S1-S2 abdomen was soft nontender feet are examined and show bilateral toenail fungus and pain and tenderness in the lateral aspect of the left ankle.  There is no imaging of the left ankle seen in our EMR  Impression is that of chronic left ankle pain unclear etiology and bilateral toenail fungus  Also impression is that of hypertension relatively well controlled  Plan will be to make a referral to podiatry for evaluation of toenail fungus and left ankle pain and as well prescribe ibuprofen 600 mg 3 times daily as needed for the left ankle pain  The [facility]

## 2019-04-20 NOTE — Progress Notes (Signed)
Refill ibuprofen  F/u with podiatry  appt [place]

## 2019-04-28 ENCOUNTER — Ambulatory Visit: Payer: Self-pay | Admitting: Family Medicine

## 2019-05-11 ENCOUNTER — Ambulatory Visit: Payer: Self-pay | Admitting: Podiatry

## 2019-05-11 ENCOUNTER — Other Ambulatory Visit: Payer: Self-pay

## 2019-05-18 ENCOUNTER — Ambulatory Visit: Payer: Self-pay | Admitting: Family Medicine

## 2019-05-25 ENCOUNTER — Ambulatory Visit: Payer: Self-pay | Admitting: Pharmacist

## 2019-05-25 ENCOUNTER — Other Ambulatory Visit: Payer: Self-pay | Admitting: Pharmacist

## 2019-05-25 DIAGNOSIS — M25572 Pain in left ankle and joints of left foot: Secondary | ICD-10-CM

## 2019-05-25 MED ORDER — IBUPROFEN 600 MG PO TABS: 600.0000 mg | ORAL_TABLET | Freq: Three times a day (TID) | ORAL | 0 refills | Status: DC | PRN

## 2019-05-25 MED FILL — IBUPROFEN 600 MG TABLET: 600 | 20 days supply | Qty: 60 | Fill #0

## 2019-07-27 ENCOUNTER — Ambulatory Visit (INDEPENDENT_AMBULATORY_CARE_PROVIDER_SITE_OTHER): Payer: Self-pay | Admitting: Podiatry

## 2019-07-27 ENCOUNTER — Ambulatory Visit (INDEPENDENT_AMBULATORY_CARE_PROVIDER_SITE_OTHER): Payer: Self-pay

## 2019-07-27 ENCOUNTER — Encounter: Payer: Self-pay | Admitting: Podiatry

## 2019-07-27 ENCOUNTER — Other Ambulatory Visit: Payer: Self-pay | Admitting: Podiatry

## 2019-07-27 ENCOUNTER — Other Ambulatory Visit: Payer: Self-pay

## 2019-07-27 VITALS — BP 132/85

## 2019-07-27 DIAGNOSIS — M25572 Pain in left ankle and joints of left foot: Secondary | ICD-10-CM

## 2019-07-27 DIAGNOSIS — M6740 Ganglion, unspecified site: Secondary | ICD-10-CM

## 2019-07-31 NOTE — Progress Notes (Signed)
Subjective:   Patient ID: Manuel Brady, male   DOB: 63 y.o.   MRN: [id number]   HPI Patient presents with a nodule on top of the left lateral foot that he states is been there for a fairly long period of time and he did go to the emergency room and they thought it was a ganglionic cyst and he wants to see if anything can be done.  Patient does not smoke likes to be active   Review of Systems  All other systems reviewed and are negative.       Objective:  Physical Exam Vitals and nursing note reviewed.  Constitutional:      Appearance: He is well-developed.  Pulmonary:     Effort: Pulmonary effort is normal.  Musculoskeletal:        General: Normal range of motion.  Skin:    General: Skin is warm.  Neurological:     Mental Status: He is alert.     Neurovascular status intact muscle strength was found to be adequate range of motion within normal limits.  Patient is noted to have enlargement of the lateral side left dorsal foot measuring about 1 cm x 8 mm that does appear to have some fibrous type appearance to it and does not appear to be freely movable within subcutaneous tissue     Assessment:  Possibility for ganglionic cyst versus other kind of mass formation     Plan:  H&P x-ray reviewed and I do think certainly this appears to be a benign type lesion has been there for fairly long time.  I did do a proximal nerve block I did sterile prep of the area aspirated I was unable to get out any gelatinous fluid but I was able to shrink it with pressure and I did not note any pathological tissue.  I did apply a small amount of steroid to try to reduce inflammation and advised if it does come back becomes painful or grows in size or changes in color it should be removed  X-rays were negative for signs of calcification or arthritis or fracture around this area

## 2019-08-24 ENCOUNTER — Other Ambulatory Visit: Payer: Self-pay

## 2019-08-24 ENCOUNTER — Ambulatory Visit: Payer: Self-pay | Admitting: Podiatry

## 2019-08-24 ENCOUNTER — Encounter: Payer: Self-pay | Admitting: Podiatry

## 2019-08-24 DIAGNOSIS — M6740 Ganglion, unspecified site: Secondary | ICD-10-CM

## 2019-08-24 NOTE — Progress Notes (Signed)
Subjective:   Patient ID: Manuel Brady, male   DOB: 64 y.o.   MRN: [id number]   HPI Patient states he is doing better from where we had aspirated the lesion but he has still some more pain and feels like he is developed 1 more proximal lesion that is moderately painful   ROS      Objective:  Physical Exam  Neurovascular status intact with patient lesions and having shrunk on the left lateral foot but there is 1 spot more proximal that irritated     Assessment:  Ganglionic cyst left that appears to be improving but there is one small area left F2     Plan:  I did drain this and then applied compression and discussed this is not a good surgical issue and I am hoping that medication and draining will continue to be effective for him and he will reappoint based on how it feels

## 2020-09-25 ENCOUNTER — Emergency Department (HOSPITAL_COMMUNITY)
Admission: EM | Admit: 2020-09-25 | Discharge: 2020-09-25 | Disposition: A | Discharge status: Home / Self Care | Payer: Medicare (Managed Care) | Attending: Emergency Medicine | Admitting: Emergency Medicine

## 2020-09-25 ENCOUNTER — Other Ambulatory Visit: Payer: Self-pay

## 2020-09-25 ENCOUNTER — Encounter (HOSPITAL_COMMUNITY): Payer: Self-pay | Admitting: Emergency Medicine

## 2020-09-25 DIAGNOSIS — Z87891 Personal history of nicotine dependence: Secondary | ICD-10-CM | POA: Insufficient documentation

## 2020-09-25 DIAGNOSIS — M67472 Ganglion, left ankle and foot: Secondary | ICD-10-CM | POA: Insufficient documentation

## 2020-09-25 DIAGNOSIS — M79672 Pain in left foot: Secondary | ICD-10-CM | POA: Diagnosis present

## 2020-09-25 MED ORDER — LIDOCAINE-EPINEPHRINE (PF) 2 %-1:200000 IJ SOLN
10.0000 mL | Freq: Once | INTRAMUSCULAR | Status: AC
  Administered 2020-09-25: 10 mL
  Filled 2020-09-25: qty 20

## 2020-09-25 NOTE — ED Provider Notes (Signed)
[facility] DEPT Provider Note   CSN: [id number] Arrival date & time: 09/25/20  [id number]     History Chief Complaint  Patient presents with  . bump on ankle     Manuel Brady is a 65 y.o. male.  Patient with history of left foot pain, ganglion cyst drainage --presents to the emergency department for evaluation of left foot pain.  Patient was previously under the care of a podiatrist in [place].  He denies fevers or chills.  2 days ago he developed a small cyst along the left lateral foot.  No redness or swelling.  No treatments prior to arrival.  States that it is painful with walking and palpation.  He has a history of getting a cortisone shot in his foot.        Past Medical History:  Diagnosis Date  . Anxiety   . Arthritis    R hip   . Depression   . Hepatitis C DX 2005   partial [facility] study  . PTSD (post-traumatic stress disorder)     Patient Active Problem List   Diagnosis Date Noted  . PTSD (post-traumatic stress disorder) 05/05/2018  . Pain, dental 08/06/2015  . Cold sore 08/06/2015  . Incidental lung nodule, greater than or equal to 50mm 06/12/2015  . Bilateral renal cysts 06/12/2015  . Liver fibrosis 05/14/2015  . Allergic rhinitis 01/28/2015  . Onychomycosis of toenail 01/28/2015  . BPH (benign prostatic hypertrophy) 01/28/2015  . Chronic hepatitis C without hepatic coma [facility])     Past Surgical History:  Procedure Laterality Date  . arm surgery Left 2003    plates in arm, arm caught in machine, crush injury   . arm surgery    . COLONOSCOPY N/A 03/05/2015   Procedure: COLONOSCOPY;  Surgeon: Danie Binder, MD;  [facility];  Service: Endoscopy;  Laterality: N/A;  11:15 AM  . KNEE SURGERY Left 2005        Family History  Problem Relation Age of Onset  . Hypertension Mother   . Cancer Father        lung  . Colon cancer Neg Hx     Social History   Tobacco Use  . Smoking status: Former Smoker    Types:  Cigarettes    Quit date: 01/08/2013    Years since quitting: 7.7  . Smokeless tobacco: Never Used  Vaping Use  . Vaping Use: Never used  Substance Use Topics  . Alcohol use: No    Alcohol/week: 0.0 standard drinks    Comment: former  . Drug use: No    Types: Cocaine    Comment: Last use two years ago (2014)    Home Medications Prior to Admission medications   Medication Sig Start Date End Date Taking? Authorizing Provider  gabapentin (NEURONTIN) 300 MG capsule Take 300 mg by mouth 2 (two) times daily.    [provider]  ibuprofen (ADVIL) 600 MG tablet Take 1 tablet (600 mg total) by mouth every 8 (eight) hours as needed for moderate pain. Take after eating 05/25/19   Fulp, Cammie, MD  lidocaine (LIDODERM) 5 % Place 1 patch onto the skin daily. Remove & Discard patch within 12 hours or as directed by MD 11/05/18   Tacy Learn, PA-C  lisinopril-hydrochlorothiazide (ZESTORETIC) 10-12.5 MG tablet Take 1 tablet by mouth daily. 04/20/19   Elsie Stain, MD  OLANZapine (ZYPREXA) 10 MG tablet Take 10 mg by mouth at bedtime.  [provider]    Allergies    Patient has no known allergies.  Review of Systems   Review of Systems  Constitutional: Negative for fever.  Gastrointestinal: Negative for nausea and vomiting.  Skin: Negative for color change.       Positive for cyst  Hematological: Negative for adenopathy.    Physical Exam Updated Vital Signs BP (!) 150/98 (BP Location: Left Arm)   Pulse 77   Temp 98.1 F (36.7 C) (Oral)   Resp 18   Ht 6\' 4"  (1.93 m)   Wt 107 kg   SpO2 97%   BMI 28.71 kg/m   Physical Exam Vitals and nursing note reviewed.  Constitutional:      Appearance: He is well-developed and well-nourished.  HENT:     Head: Normocephalic and atraumatic.  Eyes:     Conjunctiva/sclera: Conjunctivae normal.  Cardiovascular:     Pulses: Normal pulses. No decreased pulses.  Musculoskeletal:        General: Tenderness present. No  edema.     Cervical back: Normal range of motion and neck supple.     Comments: Patient with point tenderness and 1 cm cyst noted to palpation just inferior to the lateral malleolus of the left ankle.  No overlying erythema.  Skin:    General: Skin is warm and dry.  Neurological:     Mental Status: He is alert.     Sensory: No sensory deficit.     Comments: Motor, sensation, and vascular distal to the injury is fully intact.   Psychiatric:        Mood and Affect: Mood and affect normal.     ED Results / Procedures / Treatments   Labs (all labs ordered are listed, but only abnormal results are displayed) Labs Reviewed - No data to display  EKG None  Radiology No results found.  Procedures .Marland KitchenIncision and Drainage  Date/Time: 09/25/2020 9:53 AM Performed by: Carlisle Cater, PA-C Authorized by: Carlisle Cater, PA-C   Consent:    Consent obtained:  Verbal   Consent given by:  Patient   Risks discussed:  Bleeding, incomplete drainage, infection and pain   Alternatives discussed:  No treatment and referral Universal protocol:    Patient identity confirmed:  Verbally with patient and provided demographic data Location:    Type:  Cyst   Size:  1cm   Location:  Lower extremity   Lower extremity location:  Foot   Foot location:  L foot Anesthesia:    Anesthesia method:  Local infiltration   Local anesthetic:  Lidocaine 2% WITH epi Procedure type:    Complexity:  Simple Procedure details:    Needle aspiration: yes     Needle size:  20 G   Drainage characteristics: thick, clear.   Drainage amount:  Scant Post-procedure details:    Procedure completion:  Tolerated     Medications Ordered in ED Medications  lidocaine-EPINEPHrine (XYLOCAINE W/EPI) 2 %-1:200000 (PF) injection 10 mL (has no administration in time range)    ED Course  I have reviewed the triage vital signs and the nursing notes.  Pertinent labs & imaging results that were available during my care of  the patient were reviewed by me and considered in my medical decision making (see chart for details).  Patient seen and examined.  Discussed patient.  He would like me to attempt drainage to help improve pain.  Will give podiatry referral [place].  We will proceed with I&D.  Discussed risk of infection.  Vital signs reviewed and are as follows: BP (!) 150/98 (BP Location: Left Arm)   Pulse 77   Temp 98.1 F (36.7 C) (Oral)   Resp 18   Ht 6\' 4"  (1.93 m)   Wt 107 kg   SpO2 97%   BMI 28.71 kg/m   I&D performed. No complication. Podiatry referral given.   The patient was urged to return to the Emergency Department or go to their PCP in 48 hours for wound recheck if the area is not significantly improved.  The patient verbalized understanding and stated agreement with this plan.     MDM Rules/Calculators/A&P                          Left foot cyst causing pain. No sign of infection. I&D as above. Podiatry referral given.    Final Clinical Impression(s) / ED Diagnoses Final diagnoses:  Ganglion cyst of left foot    Rx / DC Orders ED Discharge Orders    None       Carlisle Cater, PA-C 09/25/20 [id number]    Varney Biles, MD 09/25/20 [phone]

## 2020-09-25 NOTE — Discharge Instructions (Signed)
Please read and follow all provided instructions.  Your diagnoses today include:  1. Ganglion cyst of left foot     Tests performed today include:  Vital signs. See below for your results today.   Medications prescribed:   None  Take any prescribed medications only as directed.  Home care instructions:   Follow any educational materials contained in this packet  Follow R.I.C.E. Protocol:  R - rest your injury   I  - use ice on injury without applying directly to skin  C - compress injury with bandage or splint  E - elevate the injury as much as possible  Follow-up instructions: Please follow-up with your primary care provider or the provided podiatrist if you continue to have significant pain in 1 week.   Return instructions:   Please return to the Emergency Department if you experience worsening symptoms.   Please return if you have any other emergent concerns.  Additional Information:  Your vital signs today were: BP (!) 150/98 (BP Location: Left Arm)   Pulse 77   Temp 98.1 F (36.7 C) (Oral)   Resp 18   Ht 6\' 4"  (1.93 m)   Wt 107 kg   SpO2 97%   BMI 28.71 kg/m  If your blood pressure (BP) was elevated above 135/85 this visit, please have this repeated by your doctor within one month. --------------

## 2020-09-25 NOTE — ED Triage Notes (Signed)
Patient complains of a 'cyst' on his L ankle x2 days. Small, pea-sized hard, mobile nodule felt on lateral L ankle, states he got a Cortizone injection in his ankle 2 days ago, but it was for something unrelated. States it is painful to palpation.

## 2020-10-04 ENCOUNTER — Encounter (HOSPITAL_COMMUNITY): Payer: Self-pay | Admitting: Emergency Medicine

## 2020-10-04 ENCOUNTER — Ambulatory Visit (HOSPITAL_COMMUNITY)
Admission: EM | Admit: 2020-10-04 | Discharge: 2020-10-04 | Disposition: A | Discharge status: Home / Self Care | Payer: Medicare (Managed Care) | Attending: Student | Admitting: Student

## 2020-10-04 ENCOUNTER — Other Ambulatory Visit: Payer: Self-pay

## 2020-10-04 DIAGNOSIS — Z20822 Contact with and (suspected) exposure to covid-19: Secondary | ICD-10-CM | POA: Insufficient documentation

## 2020-10-04 DIAGNOSIS — J3089 Other allergic rhinitis: Secondary | ICD-10-CM | POA: Insufficient documentation

## 2020-10-04 DIAGNOSIS — Z1152 Encounter for screening for COVID-19: Secondary | ICD-10-CM | POA: Insufficient documentation

## 2020-10-04 DIAGNOSIS — J0100 Acute maxillary sinusitis, unspecified: Secondary | ICD-10-CM

## 2020-10-04 MED ORDER — AMOXICILLIN-POT CLAVULANATE 875-125 MG PO TABS: 1.0000 | ORAL_TABLET | Freq: Two times a day (BID) | ORAL | 0 refills | Status: DC

## 2020-10-04 NOTE — ED Provider Notes (Signed)
[place]    CSN: [id number] Arrival date & time: 10/04/20  1610      History   Chief Complaint Chief Complaint  Patient presents with  . Otalgia  . Nasal Congestion  . Cough    HPI Manuel Brady is a 64 y.o. male presenting with bilateral ear pressure , nasal congestion, sinus pressure and occ cough x4 days. History anxiety, arthritis, depression, hep c, ptsd, BPH, allergic rhinitis, liver fibrosis. Notes recent exposure to covid at home, and has not been tested for covid since then. States multiple times his PCP always prescribes him Amoxicillin for these symptoms and this is what he's here for. He presented to different urgent care few weeks ago and was treated with z-pack without improvement. States he has a history of allergic rhinitis for which he takes OTC antihistamine. Denies fevers/chills, n/v/d, shortness of breath, chest pain,  teeth pain, headaches, sore throat, loss of taste/smell, swollen lymph nodes, hearing changes, dizziness, tinnitus. Partially vaccinated for covid19.   HPI  Past Medical History:  Diagnosis Date  . Anxiety   . Arthritis    R hip   . Depression   . Hepatitis C DX 2005   partial [facility] study  . PTSD (post-traumatic stress disorder)     Patient Active Problem List   Diagnosis Date Noted  . PTSD (post-traumatic stress disorder) 05/05/2018  . Pain, dental 08/06/2015  . Cold sore 08/06/2015  . Incidental lung nodule, greater than or equal to 59mm 06/12/2015  . Bilateral renal cysts 06/12/2015  . Liver fibrosis 05/14/2015  . Allergic rhinitis 01/28/2015  . Onychomycosis of toenail 01/28/2015  . BPH (benign prostatic hypertrophy) 01/28/2015  . Chronic hepatitis C without hepatic coma [facility])     Past Surgical History:  Procedure Laterality Date  . arm surgery Left 2003    plates in arm, arm caught in machine, crush injury   . arm surgery    . COLONOSCOPY N/A 03/05/2015   Procedure: COLONOSCOPY;  Surgeon: Danie Binder, MD;   [facility];  Service: Endoscopy;  Laterality: N/A;  11:15 AM  . KNEE SURGERY Left 2005        Home Medications    Prior to Admission medications   Medication Sig Start Date End Date Taking? Authorizing Provider  amoxicillin-clavulanate (AUGMENTIN) 875-125 MG tablet Take 1 tablet by mouth every 12 (twelve) hours. 10/04/20  Yes Hazel Sams, PA-C  gabapentin (NEURONTIN) 300 MG capsule Take 300 mg by mouth 2 (two) times daily.   Yes [provider]  ibuprofen (ADVIL) 600 MG tablet Take 1 tablet (600 mg total) by mouth every 8 (eight) hours as needed for moderate pain. Take after eating 05/25/19  Yes Fulp, Cammie, MD  lidocaine (LIDODERM) 5 % Place 1 patch onto the skin daily. Remove & Discard patch within 12 hours or as directed by MD 11/05/18  Yes Tacy Learn, PA-C  lisinopril-hydrochlorothiazide (ZESTORETIC) 10-12.5 MG tablet Take 1 tablet by mouth daily. 04/20/19  Yes Elsie Stain, MD  OLANZapine (ZYPREXA) 10 MG tablet Take 10 mg by mouth at bedtime.   Yes [provider]    Family History Family History  Problem Relation Age of Onset  . Hypertension Mother   . Cancer Father        lung  . Colon cancer Neg Hx     Social History Social History   Tobacco Use  . Smoking status: Former Smoker    Types: Cigarettes  Quit date: 01/08/2013    Years since quitting: 7.7  . Smokeless tobacco: Never Used  Vaping Use  . Vaping Use: Never used  Substance Use Topics  . Alcohol use: No    Alcohol/week: 0.0 standard drinks    Comment: former  . Drug use: No    Types: Cocaine    Comment: Last use two years ago (2014)     Allergies   Patient has no known allergies.   Review of Systems Review of Systems  Constitutional: Negative for appetite change, chills and fever.  HENT: Positive for congestion and sinus pressure. Negative for ear pain, rhinorrhea, sinus pain and sore throat.   Eyes: Negative for redness and visual disturbance.   Respiratory: Positive for cough. Negative for chest tightness, shortness of breath and wheezing.   Cardiovascular: Negative for chest pain and palpitations.  Gastrointestinal: Negative for abdominal pain, constipation, diarrhea, nausea and vomiting.  Genitourinary: Negative for dysuria, frequency and urgency.  Musculoskeletal: Negative for myalgias.  Neurological: Negative for dizziness, weakness and headaches.  Psychiatric/Behavioral: Negative for confusion.  All other systems reviewed and are negative.    Physical Exam Triage Vital Signs ED Triage Vitals  Enc Vitals Group     BP 10/04/20 1744 121/85     Pulse Rate 10/04/20 1744 84     Resp --      Temp 10/04/20 1744 98.3 F (36.8 C)     Temp Source 10/04/20 1744 Oral     SpO2 10/04/20 1744 98 %     Weight --      Height --      Head Circumference --      Peak Flow --      Pain Score 10/04/20 1741 8     Pain Loc --      Pain Edu? --      Excl. in [place]? --    No data found.  Updated Vital Signs BP 121/85 (BP Location: Right Arm)   Pulse 84   Temp 98.3 F (36.8 C) (Oral)   SpO2 98%   Visual Acuity Right Eye Distance:   Left Eye Distance:   Bilateral Distance:    Right Eye Near:   Left Eye Near:    Bilateral Near:     Physical Exam Vitals reviewed.  Constitutional:      General: He is not in acute distress.    Appearance: Normal appearance. He is not ill-appearing.  HENT:     Head: Normocephalic and atraumatic.     Right Ear: Hearing, tympanic membrane, ear canal and external ear normal. No swelling or tenderness. There is no impacted cerumen. No mastoid tenderness. Tympanic membrane is not perforated, erythematous, retracted or bulging.     Left Ear: Hearing, tympanic membrane, ear canal and external ear normal. No swelling or tenderness. There is no impacted cerumen. No mastoid tenderness. Tympanic membrane is not perforated, erythematous, retracted or bulging.     Nose:     Right Sinus: Maxillary sinus  tenderness present. No frontal sinus tenderness.     Left Sinus: Maxillary sinus tenderness present. No frontal sinus tenderness.     Mouth/Throat:     Mouth: Mucous membranes are moist.     Pharynx: Uvula midline. No oropharyngeal exudate or posterior oropharyngeal erythema.     Tonsils: No tonsillar exudate.  Cardiovascular:     Rate and Rhythm: Normal rate and regular rhythm.     Heart sounds: Normal heart sounds.  Pulmonary:     Breath  sounds: Normal breath sounds and air entry. No wheezing, rhonchi or rales.  Chest:     Chest wall: No tenderness.  Abdominal:     General: Abdomen is flat. Bowel sounds are normal.     Tenderness: There is no abdominal tenderness. There is no guarding or rebound.  Lymphadenopathy:     Cervical: No cervical adenopathy.  Neurological:     General: No focal deficit present.     Mental Status: He is alert and oriented to person, place, and time.  Psychiatric:        Attention and Perception: Attention and perception normal.        Mood and Affect: Mood and affect normal.        Behavior: Behavior normal. Behavior is cooperative.        Thought Content: Thought content normal.        Judgment: Judgment normal.      UC Treatments / Results  Labs (all labs ordered are listed, but only abnormal results are displayed) Labs Reviewed  SARS CORONAVIRUS 2 (TAT 6-24 HRS)    EKG   Radiology No results found.  Procedures Procedures (including critical care time)  Medications Ordered in UC Medications - No data to display  Initial Impression / Assessment and Plan / UC Course  I have reviewed the triage vital signs and the nursing notes.  Pertinent labs & imaging results that were available during my care of the patient were reviewed by me and considered in my medical decision making (see chart for details).     This patient is a 65 year old male presenting with few weeks of sinus pressure and congestion. Also recent exposure to covid. Today  he is  afebrile nontachycardic nontachypneic, oxygenating well on room air.  States he was recently treated for this with z-pack without improvement.   Augmentin sent as below.  Covid test sent. Isolation as per CDC guidelines.  Rec OTC antihistamine for allergic rhintis component.  Referral for new PCP placed.  Spent over 40 minutes obtaining H&P, performing physical, treatment plan and plan for follow-up with patient. Patient agrees with plan.     Final Clinical Impressions(s) / UC Diagnoses   Final diagnoses:  Acute non-recurrent maxillary sinusitis  Non-seasonal allergic rhinitis due to other allergic trigger  Exposure to confirmed case of COVID-19  Encounter for screening for COVID-19     Discharge Instructions     -Start the antibiotic- augmentin (amoxicillin-clauvulanate), twice daily for 7 days. You can take this with food.  -Continue antihistimine for allergic component -Seek additional medical attention if chest pain, shortness of breath, fevers/chils, worsening of symptoms despite treatment.  -I placed a referral for a new primary care provider. You should receive a call in the next few days.     ED Prescriptions    Medication Sig Dispense Auth. Provider   amoxicillin-clavulanate (AUGMENTIN) 875-125 MG tablet Take 1 tablet by mouth every 12 (twelve) hours. 14 tablet Hazel Sams, PA-C     PDMP not reviewed this encounter.   Hazel Sams, PA-C 10/04/20 1829

## 2020-10-04 NOTE — Discharge Instructions (Addendum)
-  Start the antibiotic- augmentin (amoxicillin-clauvulanate), twice daily for 7 days. You can take this with food.  -Continue antihistimine for allergic component -Seek additional medical attention if chest pain, shortness of breath, fevers/chils, worsening of symptoms despite treatment.  -I placed a referral for a new primary care provider. You should receive a call in the next few days.

## 2020-10-04 NOTE — ED Triage Notes (Signed)
Pt presents today with c/o of nasal congestion, bilateral ear pain and cough x 4 days. Denies fever.

## 2020-10-05 LAB — SARS CORONAVIRUS 2 (TAT 6-24 HRS): SARS Coronavirus 2: NEGATIVE

## 2021-03-17 ENCOUNTER — Encounter (HOSPITAL_COMMUNITY): Payer: Self-pay

## 2021-03-17 ENCOUNTER — Ambulatory Visit (HOSPITAL_COMMUNITY)
Admission: EM | Admit: 2021-03-17 | Discharge: 2021-03-17 | Disposition: A | Discharge status: Home / Self Care | Payer: Medicare HMO | Attending: Emergency Medicine | Admitting: Emergency Medicine

## 2021-03-17 ENCOUNTER — Other Ambulatory Visit: Payer: Self-pay

## 2021-03-17 DIAGNOSIS — U071 COVID-19: Secondary | ICD-10-CM | POA: Insufficient documentation

## 2021-03-17 DIAGNOSIS — Z20822 Contact with and (suspected) exposure to covid-19: Secondary | ICD-10-CM | POA: Insufficient documentation

## 2021-03-17 DIAGNOSIS — J069 Acute upper respiratory infection, unspecified: Secondary | ICD-10-CM | POA: Diagnosis present

## 2021-03-17 LAB — SARS CORONAVIRUS 2 (TAT 6-24 HRS): SARS Coronavirus 2: POSITIVE — AB

## 2021-03-17 MED ORDER — FLUTICASONE PROPIONATE 50 MCG/ACT NA SUSP: 2.0000 | Freq: Every day | NASAL | 0 refills | Status: DC

## 2021-03-17 MED ORDER — IBUPROFEN 600 MG PO TABS: 600.0000 mg | ORAL_TABLET | Freq: Four times a day (QID) | ORAL | 0 refills | Status: DC | PRN

## 2021-03-17 NOTE — Discharge Instructions (Addendum)
Take 600 mg of ibuprofen up to 3-4 times a day as needed for headache.  Saline nasal rinse and distilled water as often as you want.  Continue mucus pills.  COVID will be back in 6 to 24 hours.  I will prescribe Paxlovid if covid test is positive.  If you are taking Paxlovid you will not be able to take the Flonase.

## 2021-03-17 NOTE — ED Triage Notes (Signed)
Presents with cough and headache  2 days.

## 2021-03-17 NOTE — ED Provider Notes (Signed)
HPI  SUBJECTIVE:  Manuel Brady is a 65 y.o. male who presents with cough starting yesterday.  He reports headaches, nasal congestion, postnasal drip starting today.  He had a negative home COVID test 2 to 3 days ago.  His mother mother currently has COVID.  His last exposure to her was today.  He got the first dose of the COVID-vaccine.  He reports chills, but no fevers, body aches, rhinorrhea, loss of sense of taste or smell, wheezing, shortness of breath, nausea, vomiting, diarrhea, abdominal pain.  He states that he finished amoxicillin for sinusitis today, and that this is feeling better.  He is taking guaifenesin, ibuprofen 200 mg twice daily and over-the-counter cold medication with improvement in symptoms.  No aggravating factors.  No antipyretic in the past 6 hours.  He has a past medical history of hepatitis C, treated, liver fibrosis.  Patient states his liver works well now, and hypertension.  He has no history of chronic kidney disease, pulmonary disease, diabetes.  [id number], Jori Moll, MD   Past Medical History:  Diagnosis Date   Anxiety    Arthritis    R hip    Depression    Hepatitis C DX 2005   partial [facility] study   PTSD (post-traumatic stress disorder)     Past Surgical History:  Procedure Laterality Date   arm surgery Left 2003    plates in arm, arm caught in machine, crush injury    arm surgery     COLONOSCOPY N/A 03/05/2015   Procedure: COLONOSCOPY;  Surgeon: Danie Binder, MD;  [facility];  Service: Endoscopy;  Laterality: N/A;  11:15 AM   KNEE SURGERY Left 2005     Family History  Problem Relation Age of Onset   Hypertension Mother    Cancer Father        lung   Colon cancer Neg Hx     Social History   Tobacco Use   Smoking status: Former    Types: Cigarettes    Quit date: 01/08/2013    Years since quitting: 8.1   Smokeless tobacco: Never  Vaping Use   Vaping Use: Never used  Substance Use Topics   Alcohol use: No    Alcohol/week: 0.0  standard drinks    Comment: former   Drug use: No    Types: Cocaine    Comment: Last use two years ago (2014)    No current facility-administered medications for this encounter.  Current Outpatient Medications:    fluticasone (FLONASE) 50 MCG/ACT nasal spray, Place 2 sprays into both nostrils daily., Disp: 16 g, Rfl: 0   ibuprofen (ADVIL) 600 MG tablet, Take 1 tablet (600 mg total) by mouth every 6 (six) hours as needed., Disp: 30 tablet, Rfl: 0   gabapentin (NEURONTIN) 300 MG capsule, Take 300 mg by mouth 2 (two) times daily., Disp: , Rfl:    lisinopril-hydrochlorothiazide (ZESTORETIC) 10-12.5 MG tablet, Take 1 tablet by mouth daily., Disp: 90 tablet, Rfl: 3   nirmatrelvir/ritonavir EUA (PAXLOVID) TABS, Take 3 tablets by mouth 2 (two) times daily for 5 days. Patient GFR is >60. Take nirmatrelvir (150 mg) two tablets twice daily for 5 days and ritonavir (100 mg) one tablet twice daily for 5 days., Disp: 30 tablet, Rfl: 0   OLANZapine (ZYPREXA) 10 MG tablet, Take 10 mg by mouth at bedtime., Disp: , Rfl:   No Known Allergies   ROS  As noted in HPI.   Physical Exam  BP (!) 141/86 (BP  Location: Right Arm)   Pulse 78   Temp 98.7 F (37.1 C) (Oral)   SpO2 98%   Constitutional: Well developed, well nourished, no acute distress Eyes:  EOMI, conjunctiva normal bilaterally HENT: Normocephalic, atraumatic,mucus membranes moist.  Positive nasal congestion.  No sinus tenderness. Respiratory: Normal inspiratory effort, lungs clear bilaterally Cardiovascular: Normal rate, regular rhythm, no murmurs rubs or gallops GI: nondistended skin: No rash, skin intact Musculoskeletal: no deformities Neurologic: Alert & oriented x 3, no focal neuro deficits Psychiatric: Speech and behavior appropriate   ED Course   Medications - No data to display  Orders Placed This Encounter  Procedures   SARS CORONAVIRUS 2 (TAT 6-24 HRS) Nasopharyngeal Nasopharyngeal Swab    Standing Status:   Standing     Number of Occurrences:   1    Order Specific Question:   Is this test for diagnosis or screening    Answer:   Diagnosis of ill patient    Order Specific Question:   Symptomatic for COVID-19 as defined by CDC    Answer:   Yes    Order Specific Question:   Date of Symptom Onset    Answer:   03/16/2021    Order Specific Question:   Hospitalized for COVID-19    Answer:   No    Order Specific Question:   Admitted to ICU for COVID-19    Answer:   No    Order Specific Question:   Previously tested for COVID-19    Answer:   Yes    Order Specific Question:   Resident in a congregate (group) care setting    Answer:   No    Order Specific Question:   Employed in healthcare setting    Answer:   No    Order Specific Question:   Has patient completed COVID vaccination(s) (2 doses of Pfizer/Moderna 1 dose of [employer])    Answer:   No    Results for orders placed or performed during the hospital encounter of 03/17/21 (from the past 24 hour(s))  SARS CORONAVIRUS 2 (TAT 6-24 HRS) Nasopharyngeal Nasopharyngeal Swab     Status: Abnormal   Collection Time: 03/17/21  2:08 PM   Specimen: Nasopharyngeal Swab  Result Value Ref Range   SARS Coronavirus 2 POSITIVE (A) NEGATIVE   No results found.  ED Clinical Impression  1. COVID-19 virus infection   2. Upper respiratory tract infection, unspecified type   3. Encounter for laboratory testing for COVID-19 virus      ED Assessment/Plan  GFR from outside labs from 05/07/2020 above 60.  Checked with pharmacy, they will accept the GFR that is less than a-year-old.  Checking COVID.  If positive, will prescribe Paxlovid in the meantime, he is to continue to "mucus pills", Flonase, saline nasal irrigation, ibuprofen as needed.    Phone number [phone].  We will contact him if it is positive.  COVID-positive.  E prescribed Paxlovid to [facility] on [address].  No Flonase, may continue other medications.  will have staff notify  patient.  Discussed labs, MDM, treatment plan, and plan for follow-up with patient. patient agrees with plan.   Meds ordered this encounter  Medications   fluticasone (FLONASE) 50 MCG/ACT nasal spray    Sig: Place 2 sprays into both nostrils daily.    Dispense:  16 g    Refill:  0   ibuprofen (ADVIL) 600 MG tablet    Sig: Take 1 tablet (600 mg total) by mouth  every 6 (six) hours as needed.    Dispense:  30 tablet    Refill:  0      *This clinic note was created using [employer]. Therefore, there may be occasional mistakes despite careful proofreading.  ?    Melynda Ripple, MD 03/18/21 1050

## 2021-03-18 ENCOUNTER — Telehealth (HOSPITAL_COMMUNITY): Payer: Self-pay | Admitting: Emergency Medicine

## 2021-03-18 DIAGNOSIS — U071 COVID-19: Secondary | ICD-10-CM

## 2021-03-18 MED ORDER — NIRMATRELVIR/RITONAVIR (PAXLOVID)TABLET: 3.0000 | ORAL_TABLET | Freq: Two times a day (BID) | ORAL | 0 refills | Status: AC

## 2021-03-18 MED ORDER — NIRMATRELVIR/RITONAVIR (PAXLOVID)TABLET: 3.0000 | ORAL_TABLET | Freq: Two times a day (BID) | ORAL | 0 refills | Status: DC

## 2021-03-18 NOTE — Telephone Encounter (Signed)
COVID-positive.  GFR above 60 on labs from 05/07/2020.  Discussed with pharmacy, they will accept GFR within a year since he has no history of renal disease.  E prescribing Paxlovid to pharmacy on record.  Patient to not take Flonase while taking the Paxlovid otherwise, he may continue the rest of his medications. Will have staff notify pt. patient wants it  sent to [facility] on [address].  E prescribed Paxlovid to this pharmacy

## 2021-03-18 NOTE — Telephone Encounter (Signed)
COVID-positive.  GFR above 60 on labs from 05/07/2020.  Discussed with pharmacy, they will accept GFR within a year since he has no history of renal disease.  E prescribing Paxlovid to pharmacy on record.  Patient to not take Flonase while taking the Paxlovid otherwise, he may continue the rest of his medications. Will have staff notify pt.

## 2021-03-27 ENCOUNTER — Encounter (HOSPITAL_COMMUNITY): Payer: Self-pay | Admitting: Emergency Medicine

## 2021-03-27 ENCOUNTER — Other Ambulatory Visit: Payer: Self-pay

## 2021-03-27 ENCOUNTER — Ambulatory Visit (HOSPITAL_COMMUNITY)
Admission: EM | Admit: 2021-03-27 | Discharge: 2021-03-27 | Disposition: A | Discharge status: Home / Self Care | Payer: Medicare HMO | Attending: Family Medicine | Admitting: Family Medicine

## 2021-03-27 DIAGNOSIS — U071 COVID-19: Secondary | ICD-10-CM | POA: Diagnosis not present

## 2021-03-27 DIAGNOSIS — R059 Cough, unspecified: Secondary | ICD-10-CM

## 2021-03-27 MED ORDER — PREDNISONE 20 MG PO TABS: 40.0000 mg | ORAL_TABLET | Freq: Every day | ORAL | 0 refills | Status: DC

## 2021-03-27 MED ORDER — PROMETHAZINE-DM 6.25-15 MG/5ML PO SYRP: 5.0000 mL | ORAL_SOLUTION | Freq: Four times a day (QID) | ORAL | 0 refills | Status: DC | PRN

## 2021-03-27 NOTE — ED Provider Notes (Signed)
[facility]    CSN: [id number] Arrival date & time: 03/27/21  [id number]      History   Chief Complaint Chief Complaint  Patient presents with   Nasal Congestion   Cough    HPI Manuel Brady is a 65 y.o. male.   Patient presenting today with 10-day history of cough, congestion, postnasal drip, fatigue.  States he was seen in the urgent care on 03/17/2021, diagnosed with COVID 19 and given Paxlovid which he completed.  He states his symptoms overall are significantly improving but still having the cough and chest congestion.  Denies shortness of breath, fevers, sore throat, congestion, intolerance to p.o., significant fatigue.  Not taking anything over-the-counter for symptoms thus far.  No past history of pulmonary disease known.   Past Medical History:  Diagnosis Date   Anxiety    Arthritis    R hip    Depression    Hepatitis C DX 2005   partial [facility] study   PTSD (post-traumatic stress disorder)     Patient Active Problem List   Diagnosis Date Noted   PTSD (post-traumatic stress disorder) 05/05/2018   Pain, dental 08/06/2015   Cold sore 08/06/2015   Incidental lung nodule, greater than or equal to 80m 06/12/2015   Bilateral renal cysts 06/12/2015   Liver fibrosis 05/14/2015   Allergic rhinitis 01/28/2015   Onychomycosis of toenail 01/28/2015   BPH (benign prostatic hypertrophy) 01/28/2015   Chronic hepatitis C without hepatic coma ([facility]     Past Surgical History:  Procedure Laterality Date   arm surgery Left 2003    plates in arm, arm caught in machine, crush injury    arm surgery     COLONOSCOPY N/A 03/05/2015   Procedure: COLONOSCOPY;  Surgeon: SDanie Binder MD;  [facility];  Service: Endoscopy;  Laterality: N/A;  11:15 AM   KNEE SURGERY Left 2005        Home Medications    Prior to Admission medications   Medication Sig Start Date End Date Taking? Authorizing Provider  predniSONE (DELTASONE) 20 MG tablet Take 2 tablets (40 mg  total) by mouth daily with breakfast. 03/27/21  Yes LVolney American PA-C  promethazine-dextromethorphan (PROMETHAZINE-DM) 6.25-15 MG/5ML syrup Take 5 mLs by mouth 4 (four) times daily as needed for cough. 03/27/21  Yes LVolney American PA-C  fluticasone ([facility] 50 MCG/ACT nasal spray Place 2 sprays into both nostrils daily. 03/17/21   MMelynda Ripple MD  gabapentin (NEURONTIN) 300 MG capsule Take 300 mg by mouth 2 (two) times daily.    [provider]  ibuprofen (ADVIL) 600 MG tablet Take 1 tablet (600 mg total) by mouth every 6 (six) hours as needed. 03/17/21   MMelynda Ripple MD  lisinopril-hydrochlorothiazide (ZESTORETIC) 10-12.5 MG tablet Take 1 tablet by mouth daily. 04/20/19   WElsie Stain MD  OLANZapine (ZYPREXA) 10 MG tablet Take 10 mg by mouth at bedtime.    [provider]    Family History Family History  Problem Relation Age of Onset   Hypertension Mother    Cancer Father        lung   Colon cancer Neg Hx     Social History Social History   Tobacco Use   Smoking status: Former    Types: Cigarettes    Quit date: 01/08/2013    Years since quitting: 8.2   Smokeless tobacco: Never  Vaping Use   Vaping Use: Never used  Substance Use Topics  Alcohol use: No    Alcohol/week: 0.0 standard drinks    Comment: former   Drug use: No    Types: Cocaine    Comment: Last use two years ago (2014)     Allergies   Patient has no known allergies.   Review of Systems Review of Systems Per HPI  Physical Exam Triage Vital Signs ED Triage Vitals  Enc Vitals Group     BP 03/27/21 1051 124/87     Pulse Rate 03/27/21 1051 81     Resp 03/27/21 1051 18     Temp 03/27/21 1051 98.2 F (36.8 C)     Temp src --      SpO2 03/27/21 1051 95 %     Weight --      Height --      Head Circumference --      Peak Flow --      Pain Score 03/27/21 1049 6     Pain Loc --      Pain Edu? --      Excl. in [place]? --    No data found.  Updated Vital  Signs BP 124/87   Pulse 81   Temp 98.2 F (36.8 C)   Resp 18   SpO2 95%   Visual Acuity Right Eye Distance:   Left Eye Distance:   Bilateral Distance:    Right Eye Near:   Left Eye Near:    Bilateral Near:     Physical Exam Vitals and nursing note reviewed.  Constitutional:      Appearance: Normal appearance.  HENT:     Head: Atraumatic.     Right Ear: Tympanic membrane normal.     Left Ear: Tympanic membrane normal.     Nose: Nose normal.     Mouth/Throat:     Mouth: Mucous membranes are moist.     Pharynx: No posterior oropharyngeal erythema.  Eyes:     Extraocular Movements: Extraocular movements intact.     Conjunctiva/sclera: Conjunctivae normal.  Cardiovascular:     Rate and Rhythm: Normal rate and regular rhythm.     Heart sounds: Normal heart sounds.  Pulmonary:     Effort: Pulmonary effort is normal.     Breath sounds: Normal breath sounds. No wheezing or rales.  Abdominal:     General: Bowel sounds are normal. There is no distension.     Palpations: Abdomen is soft.     Tenderness: There is no abdominal tenderness. There is no right CVA tenderness, left CVA tenderness or guarding.  Musculoskeletal:        General: Normal range of motion.     Cervical back: Normal range of motion and neck supple.  Skin:    General: Skin is warm and dry.  Neurological:     General: No focal deficit present.     Mental Status: He is oriented to person, place, and time.     Motor: No weakness.     Gait: Gait normal.  Psychiatric:        Mood and Affect: Mood normal.        Thought Content: Thought content normal.        Judgment: Judgment normal.     UC Treatments / Results  Labs (all labs ordered are listed, but only abnormal results are displayed) Labs Reviewed - No data to display  EKG   Radiology No results found.  Procedures Procedures (including critical care time)  Medications Ordered in UC Medications - No  data to display  Initial Impression /  Assessment and Plan / UC Course  I have reviewed the triage vital signs and the nursing notes.  Pertinent labs & imaging results that were available during my care of the patient were reviewed by me and considered in my medical decision making (see chart for details).     Resolving COVID-19 infection.  Has completed Paxlovid.  Vital signs benign and within normal limits, exam very reassuring today.  For ongoing cough, will treat with prednisone burst, Phenergan DM, Mucinex and Flonase.  Supportive home care reviewed.  Return for acutely worsening symptoms.  Final Clinical Impressions(s) / UC Diagnoses   Final diagnoses:  COVID-19  Cough   Discharge Instructions   None    ED Prescriptions     Medication Sig Dispense Auth. Provider   predniSONE (DELTASONE) 20 MG tablet Take 2 tablets (40 mg total) by mouth daily with breakfast. 10 tablet Volney American, PA-C   promethazine-dextromethorphan (PROMETHAZINE-DM) 6.25-15 MG/5ML syrup Take 5 mLs by mouth 4 (four) times daily as needed for cough. 100 mL Volney American, [place]      PDMP not reviewed this encounter.   Volney American, [place] 03/27/21 1208

## 2021-03-27 NOTE — ED Triage Notes (Signed)
Pt is present today with cough, chest congestion, and nasal congestion. Pt states that his sx started Monday.

## 2021-04-01 ENCOUNTER — Encounter: Payer: Self-pay | Admitting: Neurology

## 2021-04-01 ENCOUNTER — Other Ambulatory Visit: Payer: Self-pay | Admitting: Family Medicine

## 2021-04-01 ENCOUNTER — Ambulatory Visit (INDEPENDENT_AMBULATORY_CARE_PROVIDER_SITE_OTHER): Payer: Medicare HMO | Admitting: Neurology

## 2021-04-01 ENCOUNTER — Other Ambulatory Visit: Payer: Self-pay

## 2021-04-01 VITALS — BP 132/90 | HR 76 | Ht 76.0 in | Wt 222.0 lb

## 2021-04-01 DIAGNOSIS — G629 Polyneuropathy, unspecified: Secondary | ICD-10-CM | POA: Insufficient documentation

## 2021-04-01 DIAGNOSIS — R202 Paresthesia of skin: Secondary | ICD-10-CM | POA: Diagnosis not present

## 2021-04-01 DIAGNOSIS — G6289 Other specified polyneuropathies: Secondary | ICD-10-CM | POA: Diagnosis not present

## 2021-04-01 NOTE — Progress Notes (Signed)
Chief Complaint  Patient presents with   New Patient (Initial Visit)    New room - alone. 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 He is currently taking gabapentin '300mg'$ , one cap QHS. Feels the medication is helpful at night. He was unaware the prescription as written for twice daily.       ASSESSMENT AND PLAN  Manuel Brady is a 65 y.o. male   Bilateral lower extremity paresthesia  Suggestive of neuropathy, length dependent,  EMG nerve conduction study  Laboratory evaluation for treatable etiology  Gabapentin 300 mg as needed   DIAGNOSTIC DATA (LABS, IMAGING, TESTING) - I reviewed patient records, labs, notes, testing and imaging myself where available.   MEDICAL HISTORY:  Manuel Brady is a 65 year old male, seen in request by his primary care physician Dr. Lorene Dy for evaluation of paresthesia, initial evaluation was on April 01, 2021.  I reviewed and summarized the referring note. PMHX. HTN Depression,  History of hepatitis C, was treated.  Since 2016, he began to notice intermittent bilateral feet paresthesia, numbness tingling burning at the bottom of his feet, bearing weight make it worse, over the years is gradually intact to above ankle level, denies fingertip paresthesia, he was given prescription for gabapentin which did help with some  He does have mild low back pain, but denies radiating pain to bilateral lower extremity, denies gait abnormality denies bowel bladder incontinence.    PHYSICAL EXAM:   Vitals:   04/01/21 0919  BP: 132/90  Pulse: 76  Weight: 222 lb (100.7 kg)  Height: '6\' 4"'$  (1.93 m)   Not recorded     Body mass index is 27.02 kg/m.  PHYSICAL EXAMNIATION:  Gen: NAD, conversant, well nourised, well groomed                     Cardiovascular: Regular rate rhythm, no peripheral edema, warm, nontender. Eyes: Conjunctivae clear without exudates or hemorrhage Neck: Supple, no carotid  bruits. Pulmonary: Clear to auscultation bilaterally   NEUROLOGICAL EXAM:  MENTAL STATUS: Speech:    Speech is normal; fluent and spontaneous with normal comprehension.  Cognition:     Orientation to time, place and person     Normal recent and remote memory     Normal Attention span and concentration     Normal Language, naming, repeating,spontaneous speech     Fund of knowledge   CRANIAL NERVES: CN II: Visual fields are full to confrontation. Pupils are round equal and briskly reactive to light. CN III, IV, VI: extraocular movement are normal. No ptosis. CN V: Facial sensation is intact to light touch CN VII: Face is symmetric with normal eye closure  CN VIII: Hearing is normal to causal conversation. CN IX, X: Phonation is normal. CN XI: Head turning and shoulder shrug are intact  MOTOR: There is no pronator drift of out-stretched arms. Muscle bulk and tone are normal. Muscle strength is normal.  REFLEXES: Reflexes are 1  and symmetric at the biceps, triceps, knees, and absent at ankles. Plantar responses are flexor.  SENSORY: Intact to light touch, pinprick and vibratory sensation are intact in fingers and toes.  COORDINATION: There is no trunk or limb dysmetria noted.  GAIT/STANCE: Posture is normal. Gait is steady with normal steps, base, arm swing, and turning. Heel and toe walking are normal. Tandem gait is normal.  Romberg is absent.  REVIEW OF SYSTEMS:  Full 14 system review of systems performed and notable only  for as above All other review of systems were negative.   ALLERGIES: No Known Allergies  HOME MEDICATIONS: Current Outpatient Medications  Medication Sig Dispense Refill   fluticasone (FLONASE) 50 MCG/ACT nasal spray Place 2 sprays into both nostrils daily. 16 g 0   gabapentin (NEURONTIN) 300 MG capsule Take 300 mg by mouth 2 (two) times daily.     ibuprofen (ADVIL) 600 MG tablet Take 1 tablet (600 mg total) by mouth every 6 (six) hours as  needed. 30 tablet 0   lisinopril-hydrochlorothiazide (ZESTORETIC) 10-12.5 MG tablet Take 1 tablet by mouth daily. 90 tablet 3   OLANZapine (ZYPREXA) 10 MG tablet Take 10 mg by mouth at bedtime.     promethazine-dextromethorphan (PROMETHAZINE-DM) 6.25-15 MG/5ML syrup Take 5 mLs by mouth 4 (four) times daily as needed for cough. 100 mL 0   No current facility-administered medications for this visit.    PAST MEDICAL HISTORY: Past Medical History:  Diagnosis Date   Anxiety    Arthritis    R hip    Depression    Hepatitis C DX 2005   partial [facility] study   Neuropathy    PTSD (post-traumatic stress disorder)     PAST SURGICAL HISTORY: Past Surgical History:  Procedure Laterality Date   arm surgery Left 2003    plates in arm, arm caught in machine, crush injury    arm surgery     COLONOSCOPY N/A 03/05/2015   Procedure: COLONOSCOPY;  Surgeon: Danie Binder, MD;  [facility];  Service: Endoscopy;  Laterality: N/A;  11:15 AM   KNEE SURGERY Left 2005     FAMILY HISTORY: Family History  Problem Relation Age of Onset   Hypertension Mother    Cancer Father        lung   Colon cancer Neg Hx     SOCIAL HISTORY: Social History   Socioeconomic History   Marital status: Single    Spouse name: Not on file   Number of children: 4   Years of education: some college   Highest education level: Not on file  Occupational History   Occupation: Disabled  Tobacco Use   Smoking status: Former    Types: Cigarettes    Quit date: 01/08/2013    Years since quitting: 8.2   Smokeless tobacco: Never  Vaping Use   Vaping Use: Never used  Substance and Sexual Activity   Alcohol use: No    Alcohol/week: 0.0 standard drinks    Comment: former   Drug use: No    Types: Cocaine    Comment: Last use two years ago (2014)   Sexual activity: Not Currently    Birth control/protection: None  Other Topics Concern   Not on file  Social History Narrative   Lives with his mother.    Right-handed.   No daily caffeine.   Social Determinants of Health   Financial Resource Strain: Not on file  Food Insecurity: Not on file  Transportation Needs: Not on file  Physical Activity: Not on file  Stress: Not on file  Social Connections: Not on file  Intimate Partner Violence: Not on file      Marcial Pacas, M.D. Ph.D.  [facility] [address], [place], [place] [address] Ph: [phone] Fax: [phone]  CC:  Lorene Dy, MD [address] [place],  [place] [address]  Lorene Dy, MD

## 2021-04-04 LAB — MULTIPLE MYELOMA PANEL, SERUM
Albumin SerPl Elph-Mcnc: 3.9 g/dL (ref 2.9–4.4)
Albumin/Glob SerPl: 1.2 (ref 0.7–1.7)
Alpha 1: 0.2 g/dL (ref 0.0–0.4)
Alpha2 Glob SerPl Elph-Mcnc: 0.7 g/dL (ref 0.4–1.0)
B-Globulin SerPl Elph-Mcnc: 1 g/dL (ref 0.7–1.3)
Gamma Glob SerPl Elph-Mcnc: 1.4 g/dL (ref 0.4–1.8)
Globulin, Total: 3.3 g/dL (ref 2.2–3.9)
IgA/Immunoglobulin A, Serum: 316 mg/dL (ref 61–437)
IgG (Immunoglobin G), Serum: 1623 mg/dL — ABNORMAL HIGH (ref 603–1613)
IgM (Immunoglobulin M), Srm: 42 mg/dL (ref 20–172)
M Protein SerPl Elph-Mcnc: 0.3 g/dL — ABNORMAL HIGH
Total Protein: 7.2 g/dL (ref 6.0–8.5)

## 2021-04-04 LAB — CBC WITH DIFFERENTIAL/PLATELET
Basophils Absolute: 0 10*3/uL (ref 0.0–0.2)
Basos: 0 %
EOS (ABSOLUTE): 0 10*3/uL (ref 0.0–0.4)
Eos: 0 %
Hematocrit: 38.9 % (ref 37.5–51.0)
Hemoglobin: 12.2 g/dL — ABNORMAL LOW (ref 13.0–17.7)
Immature Grans (Abs): 0 10*3/uL (ref 0.0–0.1)
Immature Granulocytes: 0 %
Lymphocytes Absolute: 1.2 10*3/uL (ref 0.7–3.1)
Lymphs: 12 %
MCH: 28.1 pg (ref 26.6–33.0)
MCHC: 31.4 g/dL — ABNORMAL LOW (ref 31.5–35.7)
MCV: 90 fL (ref 79–97)
Monocytes Absolute: 0.3 10*3/uL (ref 0.1–0.9)
Monocytes: 3 %
Neutrophils Absolute: 8.5 10*3/uL — ABNORMAL HIGH (ref 1.4–7.0)
Neutrophils: 85 %
Platelets: 230 10*3/uL (ref 150–450)
RBC: 4.34 x10E6/uL (ref 4.14–5.80)
RDW: 11.8 % (ref 11.6–15.4)
WBC: 10.1 10*3/uL (ref 3.4–10.8)

## 2021-04-04 LAB — ANA W/REFLEX IF POSITIVE: Anti Nuclear Antibody (ANA): NEGATIVE

## 2021-04-04 LAB — VITAMIN B12: Vitamin B-12: 759 pg/mL (ref 232–1245)

## 2021-04-04 LAB — IRON AND TIBC
Iron Saturation: 61 % — ABNORMAL HIGH (ref 15–55)
Iron: 141 ug/dL (ref 38–169)
Total Iron Binding Capacity: 233 ug/dL — ABNORMAL LOW (ref 250–450)
UIBC: 92 ug/dL — ABNORMAL LOW (ref 111–343)

## 2021-04-04 LAB — RPR: RPR Ser Ql: NONREACTIVE

## 2021-04-04 LAB — FERRITIN: Ferritin: 374 ng/mL (ref 30–400)

## 2021-04-04 LAB — FOLATE: Folate: 16.9 ng/mL (ref >3.0)

## 2021-04-04 LAB — HIV ANTIBODY (ROUTINE TESTING W REFLEX): HIV Screen 4th Generation wRfx: NONREACTIVE

## 2021-04-04 LAB — CK: Total CK: 115 U/L (ref 41–331)

## 2021-04-04 LAB — C-REACTIVE PROTEIN: CRP: <1 mg/L (ref 0–10)

## 2021-04-04 LAB — TSH: TSH: 0.409 u[IU]/mL — ABNORMAL LOW (ref 0.450–4.500)

## 2021-04-04 LAB — VITAMIN D 25 HYDROXY (VIT D DEFICIENCY, FRACTURES): Vit D, 25-Hydroxy: 49.9 ng/mL (ref 30.0–100.0)

## 2021-04-04 LAB — LYME DISEASE SEROLOGY W/REFLEX: Lyme Total Antibody EIA: NEGATIVE

## 2021-04-04 LAB — SEDIMENTATION RATE: Sed Rate: 7 mm/hr (ref 0–30)

## 2021-04-04 LAB — HGB A1C W/O EAG: Hgb A1c MFr Bld: 5.3 % (ref 4.8–5.6)

## 2021-04-10 ENCOUNTER — Ambulatory Visit (HOSPITAL_COMMUNITY)
Admission: EM | Admit: 2021-04-10 | Discharge: 2021-04-10 | Disposition: A | Discharge status: Home / Self Care | Payer: Medicare HMO | Attending: Family Medicine | Admitting: Family Medicine

## 2021-04-10 ENCOUNTER — Other Ambulatory Visit: Payer: Self-pay

## 2021-04-10 ENCOUNTER — Encounter (HOSPITAL_COMMUNITY): Payer: Self-pay

## 2021-04-10 DIAGNOSIS — S29011A Strain of muscle and tendon of front wall of thorax, initial encounter: Secondary | ICD-10-CM | POA: Diagnosis not present

## 2021-04-10 MED ORDER — CYCLOBENZAPRINE HCL 5 MG PO TABS: ORAL_TABLET | ORAL | 0 refills | Status: DC

## 2021-04-10 NOTE — ED Provider Notes (Signed)
[place]   [id number] 04/10/21 Arrival Time: [id number]  ASSESSMENT & PLAN:  1. Muscle strain of anterior chest wall    No indication for chest imaging.  Begin: Meds ordered this encounter  Medications   cyclobenzaprine (FLEXERIL) 5 MG tablet    Sig: Take 1 tablet by mouth 3 times daily as needed for muscle spasm. Warning: May cause drowsiness.    Dispense:  21 tablet    Refill:  0   Will hold off on lifting for a couple of weeks.   Recommend:  Follow-up Information     Lorene Dy, MD.   Specialty: Internal Medicine Why: If worsening or failing to improve as anticipated. Contact information: [place], [place] [address] [phone]                Reviewed expectations re: course of current medical issues. Questions answered. Outlined signs and symptoms indicating need for more acute intervention. Patient verbalized understanding. After Visit Summary given.  SUBJECTIVE: History from: patient. Manuel Brady is a 65 y.o. male who reports midline to slightly R anterior chest wall pain; x 1 week; abrupt onset while bench pressing weights; "felt a pop then felt the pain". Took a muscle relaxer given to wife; helped; requests Rx. Without SOB/n/v.  Past Surgical History:  Procedure Laterality Date   arm surgery Left 2003    plates in arm, arm caught in machine, crush injury    arm surgery     COLONOSCOPY N/A 03/05/2015   Procedure: COLONOSCOPY;  Surgeon: Danie Binder, MD;  [facility];  Service: Endoscopy;  Laterality: N/A;  11:15 AM   KNEE SURGERY Left 2005       OBJECTIVE:  Vitals:   04/10/21 1513  BP: (!) 127/92  Pulse: 86  Resp: 18  Temp: 98.3 F (36.8 C)  TempSrc: Oral  SpO2: 98%    General appearance: alert; no distress HEENT: [place]; AT Neck: supple with FROM Resp: unlabored respirations; CTAB Chest wall: tender just to R of inferior sternum; no gross abnormalities; no brusing Skin: warm and dry; no  visible rashes Psychological: alert and cooperative; normal mood and affect   No Known Allergies  Past Medical History:  Diagnosis Date   Anxiety    Arthritis    R hip    Depression    Hepatitis C DX 2005   partial [facility] study   Neuropathy    PTSD (post-traumatic stress disorder)    Social History   Socioeconomic History   Marital status: Single    Spouse name: Not on file   Number of children: 4   Years of education: some college   Highest education level: Not on file  Occupational History   Occupation: Disabled  Tobacco Use   Smoking status: Former    Types: Cigarettes    Quit date: 01/08/2013    Years since quitting: 8.2   Smokeless tobacco: Never  Vaping Use   Vaping Use: Never used  Substance and Sexual Activity   Alcohol use: No    Alcohol/week: 0.0 standard drinks    Comment: former   Drug use: No    Types: Cocaine    Comment: Last use two years ago (2014)   Sexual activity: Not Currently    Birth control/protection: None  Other Topics Concern   Not on file  Social History Narrative   Lives with his mother.   Right-handed.   No daily caffeine.   Social Determinants of Health  Financial Resource Strain: Not on file  Food Insecurity: Not on file  Transportation Needs: Not on file  Physical Activity: Not on file  Stress: Not on file  Social Connections: Not on file   Family History  Problem Relation Age of Onset   Hypertension Mother    Cancer Father        lung   Colon cancer Neg Hx    Past Surgical History:  Procedure Laterality Date   arm surgery Left 2003    plates in arm, arm caught in machine, crush injury    arm surgery     COLONOSCOPY N/A 03/05/2015   Procedure: COLONOSCOPY;  Surgeon: Danie Binder, MD;  [facility];  Service: Endoscopy;  Laterality: N/A;  11:15 AM   KNEE SURGERY Left 2005        Vanessa Kick, MD 04/10/21 1623

## 2021-04-10 NOTE — ED Triage Notes (Signed)
Pt reports right sided chest pain x 8 days. Reports he was lifting weights and hear a pop. Muscle relaxer gives relief.

## 2021-06-04 ENCOUNTER — Ambulatory Visit (INDEPENDENT_AMBULATORY_CARE_PROVIDER_SITE_OTHER): Payer: Medicare HMO | Admitting: Neurology

## 2021-06-04 DIAGNOSIS — G6289 Other specified polyneuropathies: Secondary | ICD-10-CM

## 2021-06-04 DIAGNOSIS — R202 Paresthesia of skin: Secondary | ICD-10-CM

## 2021-06-04 NOTE — Progress Notes (Signed)
ASSESSMENT AND PLAN  Manuel Brady is a 65 y.o. male   Left toe paresthesia,  No evidence of large fiber peripheral neuropathy by electrodiagnostic study, no evidence of lumbosacral radiculopathy  Only mildly symptomatic, improved by gabapentin,  Laboratory evaluation showed no significant abnormality  Continue to observe symptoms,  Only return to clinic for new issues     DIAGNOSTIC DATA (LABS, IMAGING, TESTING) - I reviewed patient records, labs, notes, testing and imaging myself where available.   MEDICAL HISTORY:  Manuel Brady is a 65 year old male, seen in request by his primary care physician Dr. Lorene Dy for evaluation of paresthesia, initial evaluation was on April 01, 2021.  I reviewed and summarized the referring note. PMHX. HTN Depression,  History of hepatitis C, was treated.  Since 2016, he began to notice intermittent bilateral feet paresthesia, numbness tingling burning at the bottom of his feet, bearing weight make it worse, over the years is gradually intact to above ankle level, denies fingertip paresthesia, he was given prescription for gabapentin which did help with some  He does have mild low back pain, but denies radiating pain to bilateral lower extremity, denies gait abnormality denies bowel bladder incontinence.  Update June 04, 2021: Patient return for electrodiagnostic study today, which showed no evidence of large fiber peripheral neuropathy of bilateral lumbosacral radiculopathy, He only has left first toe numbness, sensitivity, this is also complicated by left knee pain, history of right hip replacement, he denies significant foot pain, tried gabapentin 300 mg as needed, which was helpful With reviewed extensive laboratory evaluations, only slightly elevated TSH, per patient, repeat thyroid function test by primary care was within normal limit, low titer M protein of 0.3, rest of the laboratory evaluation showed no significant  abnormality     PHYSICAL EXAM:   There were no vitals filed for this visit.  PHYSICAL EXAMNIATION:  Gen: NAD, conversant, well nourised, well groomed                MENTAL STATUS: Speech/cognition: Awake, alert, oriented to history taking and casual conversation   CRANIAL NERVES: CN II: Visual fields are full to confrontation. Pupils are round equal and briskly reactive to light. CN III, IV, VI: extraocular movement are normal. No ptosis. CN V: Facial sensation is intact to light touch CN VII: Face is symmetric with normal eye closure  CN VIII: Hearing is normal to causal conversation. CN IX, X: Phonation is normal. CN XI: Head turning and shoulder shrug are intact  MOTOR: There is no pronator drift of out-stretched arms. Muscle bulk and tone are normal. Muscle strength is normal.  REFLEXES: Reflexes are 1  and symmetric at the biceps, triceps, knees, and absent at ankles. Plantar responses are flexor.  SENSORY: Intact to light touch, pinprick and vibratory sensation are intact in fingers and toes.  Left toe sensitivity  COORDINATION: There is no trunk or limb dysmetria noted.  GAIT/STANCE: Posture is normal. Gait is steady   REVIEW OF SYSTEMS:  Full 14 system review of systems performed and notable only for as above All other review of systems were negative.   ALLERGIES: No Known Allergies  HOME MEDICATIONS: Current Outpatient Medications  Medication Sig Dispense Refill   cyclobenzaprine (FLEXERIL) 5 MG tablet Take 1 tablet by mouth 3 times daily as needed for muscle spasm. Warning: May cause drowsiness. 21 tablet 0   gabapentin (NEURONTIN) 300 MG capsule Take 300 mg by mouth 2 (two) times daily.  ibuprofen (ADVIL) 600 MG tablet Take 1 tablet (600 mg total) by mouth every 6 (six) hours as needed. 30 tablet 0   lisinopril-hydrochlorothiazide (ZESTORETIC) 10-12.5 MG tablet Take 1 tablet by mouth daily. 90 tablet 3   LUMIGAN 0.01 % SOLN Place 1 drop into the  left eye 2 (two) times daily.     OLANZapine (ZYPREXA) 10 MG tablet Take 10 mg by mouth at bedtime.     prazosin (MINIPRESS) 1 MG capsule Take by mouth.     No current facility-administered medications for this visit.    PAST MEDICAL HISTORY: Past Medical History:  Diagnosis Date   Anxiety    Arthritis    R hip    Depression    Hepatitis C DX 2005   partial [facility] study   Neuropathy    PTSD (post-traumatic stress disorder)     PAST SURGICAL HISTORY: Past Surgical History:  Procedure Laterality Date   arm surgery Left 2003    plates in arm, arm caught in machine, crush injury    arm surgery     COLONOSCOPY N/A 03/05/2015   Procedure: COLONOSCOPY;  Surgeon: Danie Binder, MD;  [facility];  Service: Endoscopy;  Laterality: N/A;  11:15 AM   KNEE SURGERY Left 2005     FAMILY HISTORY: Family History  Problem Relation Age of Onset   Hypertension Mother    Cancer Father        lung   Colon cancer Neg Hx     SOCIAL HISTORY: Social History   Socioeconomic History   Marital status: Single    Spouse name: Not on file   Number of children: 4   Years of education: some college   Highest education level: Not on file  Occupational History   Occupation: Disabled  Tobacco Use   Smoking status: Former    Types: Cigarettes    Quit date: 01/08/2013    Years since quitting: 8.4   Smokeless tobacco: Never  Vaping Use   Vaping Use: Never used  Substance and Sexual Activity   Alcohol use: No    Alcohol/week: 0.0 standard drinks    Comment: former   Drug use: No    Types: Cocaine    Comment: Last use two years ago (2014)   Sexual activity: Not Currently    Birth control/protection: None  Other Topics Concern   Not on file  Social History Narrative   Lives with his mother.   Right-handed.   No daily caffeine.   Social Determinants of Health   Financial Resource Strain: Not on file  Food Insecurity: Not on file  Transportation Needs: Not on file  Physical  Activity: Not on file  Stress: Not on file  Social Connections: Not on file  Intimate Partner Violence: Not on file      Marcial Pacas, M.D. Ph.D.  [facility] [address] [place], [place] [address] Ph: [phone] Fax: [phone]  CC:  Lorene Dy, MD [address], [place],  [place] [address]  Lorene Dy, MD

## 2021-06-04 NOTE — Procedures (Signed)
Full Name: Manuel Brady Gender: Male MRN #: [id number] Date of Birth: June 21, 1956    Visit Date: 06/04/2021 08:53 Age: 65 Years Examining Physician: Marcial Pacas, MD  Referring Physician: Marcial Pacas, MD Height: 6 feet 4 inch Patient weight weight 222lbs  History: 65 year old male, with left toe paresthesia, history of right knee pain, left hip replacement, intermittent low back pain  Summary of the test:  Nerve conduction study:  Bilateral sural, superficial peroneal sensory responses were normal.  Bilateral peroneal to EDB, tibial motor responses were normal  Electromyography:  Selected needle examination of bilateral lower extremity muscles bilateral lumbosacral paraspinal muscles showed no significant abnormalities.   Conclusion: This is a normal study.  There is no electrodiagnostic evidence of large fiber peripheral neuropathy or bilateral lumbosacral radiculopathy.      ------------------------------- Marcial Pacas, M.D. PhD  [facility] [address] [place], [place] [address] Tel: [phone] Fax: [phone]  Verbal informed consent was obtained from the patient, patient was informed of potential risk of procedure, including bruising, bleeding, hematoma formation, infection, muscle weakness, muscle pain, numbness, among others.        [place]    Nerve / Sites Muscle Latency Ref. Amplitude Ref. Rel Amp Segments Distance Velocity Ref. Area    ms ms mV mV %  cm m/s m/s mVms  L Peroneal - EDB     Ankle EDB 2.6 ?6.5 5.2 ?2.0 100 Ankle - EDB 9   17.8     Fib head EDB 9.7  5.1  97.5 Fib head - Ankle 31 44 ?44 16.2     Pop fossa EDB 12.0  4.9  96.8 Pop fossa - Fib head 10 44 ?44 16.0         Pop fossa - Ankle      R Peroneal - EDB     Ankle EDB 3.9 ?6.5 7.5 ?2.0 100 Ankle - EDB 9   20.5     Fib head EDB 10.9  6.8  91.4 Fib head - Ankle 32 46 ?44 19.4     Pop fossa EDB 13.0  6.3  92.6 Pop fossa - Fib head 10 48 ?44 18.1         Pop  fossa - Ankle      L Tibial - AH     Ankle AH 3.0 ?5.8 7.9 ?4.0 100 Ankle - AH 9   20.6     Pop fossa AH 11.9  6.8  85.7 Pop fossa - Ankle 41 46 ?41 20.6  R Tibial - AH     Ankle AH 3.7 ?5.8 11.1 ?4.0 100 Ankle - AH 9   24.2     Pop fossa AH 13.3  9.9  89.4 Pop fossa - Ankle 41 43 ?41 25.2             SNC    Nerve / Sites Rec. Site Peak Lat Ref.  Amp Ref. Segments Distance    ms ms V V  cm  L Sural - Ankle (Calf)     Calf Ankle 3.6 ?4.4 18 ?6 Calf - Ankle 14  R Sural - Ankle (Calf)     Calf Ankle 3.2 ?4.4 22 ?6 Calf - Ankle 14  L Superficial peroneal - Ankle     Lat leg Ankle 3.7 ?4.4 10 ?6 Lat leg - Ankle 14  R Superficial peroneal - Ankle     Lat leg Ankle 3.6 ?4.4 13 ?6 Lat leg -  Ankle 14             F  Wave    Nerve F Lat Ref.   ms ms  L Tibial - AH 55.0 ?56.0  R Tibial - AH 55.3 ?56.0         EMG Summary Table    Spontaneous MUAP Recruitment  Muscle IA Fib PSW Fasc Other Amp Dur. Poly Pattern  L. Tibialis anterior Increased None None None _______ Normal Normal Normal Reduced  L. Tibialis posterior Normal None None None _______ Normal Normal Normal Normal  L. Peroneus longus Normal None None None _______ Normal Normal Normal Normal  L. Vastus lateralis Normal None None None _______ Normal Normal Normal Normal  R. Tibialis anterior Normal None None None _______ Normal Normal Normal Reduced  R. Tibialis posterior Normal None None None _______ Normal Normal Normal Normal  R. Peroneus longus Normal None None None _______ Normal Normal Normal Normal  R. Vastus lateralis Normal None None None _______ Normal Normal Normal Normal  R. Lumbar paraspinals (low) Normal None None None _______ Normal Normal Normal Normal  R. Lumbar paraspinals (mid) Normal None None None _______ Normal Normal Normal Normal  L. Lumbar paraspinals (low) Normal None None None _______ Normal Normal Normal Normal  L. Lumbar paraspinals (mid) Normal None None None _______ Normal Normal Normal Normal  L.  Gluteus medius Normal None None None _______ Normal Normal Normal Normal  L. Biceps femoris (short head) Normal None None None _______ Normal Normal Normal Normal

## 2021-06-05 ENCOUNTER — Telehealth: Payer: Self-pay | Admitting: Neurology

## 2021-06-05 DIAGNOSIS — G8929 Other chronic pain: Secondary | ICD-10-CM

## 2021-06-05 DIAGNOSIS — M5440 Lumbago with sciatica, unspecified side: Secondary | ICD-10-CM

## 2021-06-05 NOTE — Telephone Encounter (Signed)
Patient left a voicemail on my phone asking to schedule an MRI. I don't see one ordered yet.

## 2021-06-05 NOTE — Telephone Encounter (Signed)
LVM relaying Dr. Rhea Belton message. Advised him to call back if any further questions.

## 2021-06-05 NOTE — Telephone Encounter (Signed)
Routed message to Dr. Krista Blue for review/approval.

## 2021-06-05 NOTE — Telephone Encounter (Signed)
Please call patient, following yesterday's office visit, we decided to continue to observe his symptoms,

## 2021-06-05 NOTE — Telephone Encounter (Signed)
Pt called stated that he never agreed to Not get an MRI done. Pt states that he is in pain and Is wanting to get the MRI scheduled. Please advise.

## 2021-06-10 DIAGNOSIS — M545 Low back pain, unspecified: Secondary | ICD-10-CM | POA: Insufficient documentation

## 2021-06-10 NOTE — Telephone Encounter (Signed)
I called patient, since last visit on June 04, 2021, he noticed increased low back pain, radiating to bilateral lower extremity, he was seen by orthopedic today, pending MRI of lumbar spine

## 2021-06-10 NOTE — Telephone Encounter (Signed)
Pt returned call. Please call back when available. 

## 2021-06-11 NOTE — Telephone Encounter (Signed)
Patient left a voicemail on my phone wanting to schedule his MRI, but I don't see an order put in.

## 2021-06-11 NOTE — Telephone Encounter (Signed)
Dr Krista Blue Do you want this w/ or w/o contrast?

## 2021-06-19 ENCOUNTER — Ambulatory Visit (INDEPENDENT_AMBULATORY_CARE_PROVIDER_SITE_OTHER): Payer: Medicare HMO | Admitting: Podiatry

## 2021-06-19 ENCOUNTER — Other Ambulatory Visit: Payer: Self-pay

## 2021-06-19 ENCOUNTER — Ambulatory Visit (INDEPENDENT_AMBULATORY_CARE_PROVIDER_SITE_OTHER): Payer: Medicare HMO

## 2021-06-19 ENCOUNTER — Encounter: Payer: Self-pay | Admitting: Podiatry

## 2021-06-19 DIAGNOSIS — M79672 Pain in left foot: Secondary | ICD-10-CM | POA: Diagnosis not present

## 2021-06-19 DIAGNOSIS — M778 Other enthesopathies, not elsewhere classified: Secondary | ICD-10-CM | POA: Diagnosis not present

## 2021-06-19 DIAGNOSIS — M21619 Bunion of unspecified foot: Secondary | ICD-10-CM | POA: Diagnosis not present

## 2021-06-19 DIAGNOSIS — M79671 Pain in right foot: Secondary | ICD-10-CM

## 2021-06-19 DIAGNOSIS — L600 Ingrowing nail: Secondary | ICD-10-CM | POA: Diagnosis not present

## 2021-06-19 MED ORDER — TRIAMCINOLONE ACETONIDE 10 MG/ML IJ SUSP
10.0000 mg | Freq: Once | INTRAMUSCULAR | Status: AC
Start: 2021-06-19 — End: 2021-06-19
  Administered 2021-06-19: 10 mg

## 2021-06-19 NOTE — Progress Notes (Signed)
Subjective:   Patient ID: Manuel Brady, male   DOB: 65 y.o.   MRN: [id number]   HPI Patient presents stating he is developed a lot of pain on the outside of his left foot he is developed an ingrown toenail of his left big toe and he has a bunion formation right over left that increasingly sore with patient having tried wider shoes and soaks without relief of the symptoms.  States its been present for a long time and he has also tried to soak the ingrown toenail.  Patient does not smoke and does like to be active   Review of Systems  All other systems reviewed and are negative.      Objective:  Physical Exam Vitals and nursing note reviewed.  Constitutional:      Appearance: He is well-developed.  Pulmonary:     Effort: Pulmonary effort is normal.  Musculoskeletal:        General: Normal range of motion.  Skin:    General: Skin is warm.  Neurological:     Mental Status: He is alert.    Neurovascular status intact muscle strength adequate range of motion within normal limits with large hyperostosis medial aspect first metatarsal head right incurvated medial border left hallux painful no active drainage redness and inflammation pain of the lateral side of the foot with fluid buildup noted.  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  I recommended the nail be corrected now and he wants this done and I allowed him to read and then signed consent form understanding risk and I infiltrated the left hallux 60 mg like Marcaine mixture sterile prep done and  removed the medial border exposed matrix applied phenol 3 applications 30 seconds followed by alcohol lavage sterile dressing.  I did a careful injection of the lateral extensor complex 3 mg Dexasone Kenalog 5 mg Xylocaine and did discuss possibility for midfoot arthritis but did not see overt arthritis  X-rays indicate that there is significant structural bunion deformity right over left mild inflammation midfoot but did not note advanced arthritis signed this

## 2021-06-19 NOTE — Patient Instructions (Signed)
Place 1/4 cup of epsom salts in a quart of warm tap water.  Submerge your foot or feet in the solution and soak for 20 minutes.  This soak should be done twice a day.  Next, remove your foot or feet from solution, blot dry the affected area. Apply ointment and cover if instructed by your doctor.   IF YOUR SKIN BECOMES IRRITATED WHILE USING THESE INSTRUCTIONS, IT IS OKAY TO SWITCH TO  WHITE VINEGAR AND WATER.  As another alternative soak, you may use antibacterial soap and water.  Monitor for any signs/symptoms of infection. Call the office immediately if any occur or go directly to the emergency room. Call with any questions/concerns.  Ingrown Toenail An ingrown toenail occurs when the corner or sides of a toenail grow into the surrounding skin. This causes discomfort and pain. The big toe is most commonly affected, but any of the toes can be affected. If an ingrown toenail is not treated, it can become infected. What are the causes? This condition may be caused by: Wearing shoes that are too small or tight. An injury, such as stubbing your toe or having your toe stepped on. Improper cutting or care of your toenails. Having nail or foot abnormalities that were present from birth (congenital abnormalities), such as having a nail that is too big for your toe. What increases the risk? The following factors may make you more likely to develop ingrown toenails: Age. Nails tend to get thicker with age, so ingrown nails are more common among older people. Cutting your toenails incorrectly, such as cutting them very short or cutting them unevenly. An ingrown toenail is more likely to get infected if you have: Diabetes. Blood flow (circulation) problems. What are the signs or symptoms? Symptoms of an ingrown toenail may include: Pain, soreness, or tenderness. Redness. Swelling. Hardening of the skin that surrounds the toenail. Signs that an ingrown toenail may be infected include: Fluid or  pus. Symptoms that get worse. How is this diagnosed? Ingrown toenails may be diagnosed based on: Your symptoms and medical history. A physical exam. Labs or tests. If you have fluid or blood coming from your toenail, a sample may be collected to test for the specific type of bacteria that is causing the infection. How is this treated? Treatment depends on the severity of your symptoms. You may be able to care for your toenail at home. If you have an infection, you may be prescribed antibiotic medicines. If you have fluid or pus draining from your toenail, your health care provider may drain it. If you have trouble walking, you may be given crutches to use. If you have a severe or infected ingrown toenail, you may need a procedure to remove part or all of the nail. Follow these instructions at home: [place] your wound every day for signs of infection, or as often as told by your health care provider. Check for: More redness, swelling, or pain. More fluid or blood. Warmth. Pus or a bad smell. Do not pick at your toenail or try to remove it yourself. Soak your foot in warm, soapy water. Do this for 20 minutes, 3 times a day, or as often as told by your health care provider. This helps to keep your toe clean and your skin soft. Wear shoes that fit well and are not too tight. Your health care provider may recommend that you wear open-toed shoes while you heal. Trim your toenails regularly and carefully. Cut your toenails  straight across to prevent injury to the skin at the corners of the toenail. Do not cut your nails in a curved shape. Keep your feet clean and dry to help prevent infection. General instructions Take over-the-counter and prescription medicines only as told by your health care provider. If you were prescribed an antibiotic, take it as told by your health care provider. Do not stop taking the antibiotic even if you start to feel better. If your health care provider  told you to use crutches to help you move around, use them as instructed. Return to your normal activities as told by your health care provider. Ask your health care provider what activities are safe for you. Keep all follow-up visits. This is important. Contact a health care provider if: You have more redness, swelling, pain, or other symptoms that do not improve with treatment. You have fluid, blood, or pus coming from your toenail. You have a red streak on your skin that starts at your foot and spreads up your leg. You have a fever. Summary An ingrown toenail occurs when the corner or sides of a toenail grow into the surrounding skin. This causes discomfort and pain. The big toe is most commonly affected, but any of the toes can be affected. If an ingrown toenail is not treated, it can become infected. Fluid or pus draining from your toenail is a sign of infection. Your health care provider may need to drain it. You may be given antibiotics to treat the infection. Trimming your toenails regularly and properly can help you prevent an ingrown toenail. This information is not intended to replace advice given to you by your health care provider. Make sure you discuss any questions you have with your health care provider. Document Revised: 11/26/2020 Document Reviewed: 11/26/2020 Elsevier Patient Education  [place].

## 2021-06-27 ENCOUNTER — Other Ambulatory Visit: Payer: Self-pay

## 2021-06-27 ENCOUNTER — Encounter (HOSPITAL_COMMUNITY): Payer: Self-pay

## 2021-06-27 ENCOUNTER — Ambulatory Visit (HOSPITAL_COMMUNITY)
Admission: EM | Admit: 2021-06-27 | Discharge: 2021-06-27 | Disposition: A | Discharge status: Home / Self Care | Payer: Medicare HMO | Attending: Medical Oncology | Admitting: Medical Oncology

## 2021-06-27 DIAGNOSIS — Z20822 Contact with and (suspected) exposure to covid-19: Secondary | ICD-10-CM | POA: Insufficient documentation

## 2021-06-27 DIAGNOSIS — J069 Acute upper respiratory infection, unspecified: Secondary | ICD-10-CM | POA: Insufficient documentation

## 2021-06-27 MED ORDER — BENZONATATE 100 MG PO CAPS: 100.0000 mg | ORAL_CAPSULE | Freq: Three times a day (TID) | ORAL | 0 refills | Status: DC

## 2021-06-27 MED ORDER — FLUTICASONE PROPIONATE 50 MCG/ACT NA SUSP: 2.0000 | Freq: Every day | NASAL | 0 refills | Status: DC

## 2021-06-27 MED ORDER — BUDESONIDE 32 MCG/ACT NA SUSP: 2.0000 | Freq: Every day | NASAL | 0 refills | Status: DC

## 2021-06-27 NOTE — ED Provider Notes (Signed)
[facility]    CSN: [id number] Arrival date & time: 06/27/21  1454      History   Chief Complaint Chief Complaint  Patient presents with   Covid +    HPI Manuel Brady is a 65 y.o. male.   HPI  Cough: Pt reports that for the past 5 days he has had cough, congestion, headache, fatigue. No SOB, chest pain, fever. He reports having a positive home COVID-19 test this morning which he brings in. He has a history of Chronic Hepatitis C and has previously been prescribed Paxlovid last year when he had COVID-19- he reports that he did well with this medication.   Past Medical History:  Diagnosis Date   Anxiety    Arthritis    R hip    Depression    Hepatitis C DX 2005   partial [facility] study   Neuropathy    PTSD (post-traumatic stress disorder)     Patient Active Problem List   Diagnosis Date Noted   Low back pain without sciatica 06/10/2021   Paresthesia 04/01/2021   Peripheral neuropathy 04/01/2021   PTSD (post-traumatic stress disorder) 05/05/2018   Pain, dental 08/06/2015   Cold sore 08/06/2015   Incidental lung nodule, greater than or equal to 32mm 06/12/2015   Bilateral renal cysts 06/12/2015   Liver fibrosis 05/14/2015   Allergic rhinitis 01/28/2015   Onychomycosis of toenail 01/28/2015   BPH (benign prostatic hypertrophy) 01/28/2015   Chronic hepatitis C without hepatic coma [facility])     Past Surgical History:  Procedure Laterality Date   arm surgery Left 2003    plates in arm, arm caught in machine, crush injury    arm surgery     COLONOSCOPY N/A 03/05/2015   Procedure: COLONOSCOPY;  Surgeon: Danie Binder, MD;  Location: [facility];  Service: Endoscopy;  Laterality: N/A;  11:15 AM   KNEE SURGERY Left 2005        Home Medications    Prior to Admission medications   Medication Sig Start Date End Date Taking? Authorizing Provider  cyclobenzaprine (FLEXERIL) 5 MG tablet Take 1 tablet by mouth 3 times daily as needed for muscle spasm.  Warning: May cause drowsiness. 04/10/21   Vanessa Kick, MD  gabapentin (NEURONTIN) 300 MG capsule Take 300 mg by mouth 2 (two) times daily.    [provider]  ibuprofen (ADVIL) 600 MG tablet Take 1 tablet (600 mg total) by mouth every 6 (six) hours as needed. 03/17/21   Melynda Ripple, MD  lisinopril-hydrochlorothiazide (ZESTORETIC) 10-12.5 MG tablet Take 1 tablet by mouth daily. 04/20/19   Elsie Stain, MD  LUMIGAN 0.01 % SOLN Place 1 drop into the left eye 2 (two) times daily. 01/02/21   [provider]  naproxen (NAPROSYN) 500 MG tablet Take by mouth. 06/25/20   [provider]  OLANZapine (ZYPREXA) 10 MG tablet Take 10 mg by mouth at bedtime.    [provider]  prazosin (MINIPRESS) 1 MG capsule Take by mouth. 08/23/20 08/23/21  [provider]  timolol (TIMOPTIC) 0.5 % ophthalmic solution Apply to eye. 08/22/20 08/22/21  [provider]    Family History Family History  Problem Relation Age of Onset   Hypertension Mother    Cancer Father        lung   Colon cancer Neg Hx     Social History Social History   Tobacco Use   Smoking status: Former    Types: Cigarettes  Quit date: 01/08/2013    Years since quitting: 8.4   Smokeless tobacco: Never  Vaping Use   Vaping Use: Never used  Substance Use Topics   Alcohol use: No    Alcohol/week: 0.0 standard drinks    Comment: former   Drug use: No    Types: Cocaine    Comment: Last use two years ago (2014)     Allergies   Patient has no known allergies.   Review of Systems Review of Systems  As stated above in HPI Physical Exam Triage Vital Signs ED Triage Vitals [06/27/21 1545]  Enc Vitals Group     BP 135/90     Pulse Rate 87     Resp 17     Temp 98.7 F (37.1 C)     Temp Source Oral     SpO2 94 %     Weight      Height      Head Circumference      Peak Flow      Pain Score 4     Pain Loc      Pain Edu?      Excl. in [place]?    No data found.  Updated  Vital Signs BP 135/90 (BP Location: Right Arm)   Pulse 87   Temp 98.7 F (37.1 C) (Oral)   Resp 17   SpO2 94%   Physical Exam Vitals and nursing note reviewed.  Constitutional:      General: He is not in acute distress.    Appearance: Normal appearance. He is not ill-appearing, toxic-appearing or diaphoretic.  HENT:     Head: Normocephalic and atraumatic.     Right Ear: Tympanic membrane normal.     Left Ear: Tympanic membrane normal.     Nose: Nose normal.     Mouth/Throat:     Mouth: Mucous membranes are moist.     Pharynx: No oropharyngeal exudate or posterior oropharyngeal erythema.  Eyes:     Extraocular Movements: Extraocular movements intact.     Pupils: Pupils are equal, round, and reactive to light.  Cardiovascular:     Rate and Rhythm: Normal rate and regular rhythm.     Heart sounds: Normal heart sounds.  Pulmonary:     Effort: Pulmonary effort is normal.     Breath sounds: Normal breath sounds.  Musculoskeletal:     Cervical back: Normal range of motion and neck supple.  Lymphadenopathy:     Cervical: No cervical adenopathy.  Skin:    General: Skin is warm.  Neurological:     Mental Status: He is alert and oriented to person, place, and time.     UC Treatments / Results  Labs (all labs ordered are listed, but only abnormal results are displayed) Labs Reviewed - No data to display  EKG   Radiology No results found.  Procedures Procedures (including critical care time)  Medications Ordered in UC Medications - No data to display  Initial Impression / Assessment and Plan / UC Course  I have reviewed the triage vital signs and the nursing notes.  Pertinent labs & imaging results that were available during my care of the patient were reviewed by me and considered in my medical decision making (see chart for details).     New.  Patient brings in a home COVID-19 test.  The control line is activated and the T line appears negative but does have a  slight pink hue.  We have elected to retest in  office and if positive he would like pack Slo-Bid which she has tolerated well in the past.  For now I have recommended Tessalon and Flonase. Final Clinical Impressions(s) / UC Diagnoses   Final diagnoses:  None   Discharge Instructions   None    ED Prescriptions   None    PDMP not reviewed this encounter.   Hughie Closs, [place] 06/27/21 1627

## 2021-06-27 NOTE — ED Triage Notes (Signed)
Pt presents with slight congestion, headache, and fatigue X 5 days; pt tested positive for covid this morning.

## 2021-06-28 LAB — SARS CORONAVIRUS 2 (TAT 6-24 HRS): SARS Coronavirus 2: NEGATIVE

## 2021-09-24 ENCOUNTER — Encounter: Payer: Self-pay | Admitting: Podiatry

## 2021-09-24 ENCOUNTER — Other Ambulatory Visit: Payer: Self-pay

## 2021-09-24 ENCOUNTER — Ambulatory Visit (INDEPENDENT_AMBULATORY_CARE_PROVIDER_SITE_OTHER): Payer: Medicare HMO | Admitting: Podiatry

## 2021-09-24 DIAGNOSIS — M778 Other enthesopathies, not elsewhere classified: Secondary | ICD-10-CM

## 2021-09-24 DIAGNOSIS — M21611 Bunion of right foot: Secondary | ICD-10-CM

## 2021-09-24 DIAGNOSIS — M21619 Bunion of unspecified foot: Secondary | ICD-10-CM

## 2021-09-24 DIAGNOSIS — B351 Tinea unguium: Secondary | ICD-10-CM | POA: Diagnosis not present

## 2021-09-24 MED ORDER — TERBINAFINE HCL 250 MG PO TABS: 250.0000 mg | ORAL_TABLET | Freq: Every day | ORAL | 0 refills | Status: DC

## 2021-09-25 NOTE — Progress Notes (Signed)
Subjective:   Patient ID: Manuel Brady, male   DOB: 66 y.o.   MRN: [id number]   HPI Patient presents stating he is ready to get the bunion fixed on his right foot stating that its been increasingly sore he is tried wider shoes he is tried soaks without relief of symptoms   ROS      Objective:  Physical Exam  Neurovascular status intact with pain of the right first metatarsal head with prominence of the bone structure and redness and discomfort with pressure.  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  Patient is scheduled for outpatient procedure all instructions given understands total recovery of approximate 6 months and air fracture walker dispensed today.  Encouraged to call with questions concerns which may arise prior to procedure

## 2021-10-09 ENCOUNTER — Ambulatory Visit: Payer: Medicare HMO | Admitting: Podiatry

## 2021-12-16 ENCOUNTER — Telehealth: Payer: Self-pay | Admitting: Urology

## 2021-12-16 NOTE — Telephone Encounter (Signed)
DOS - 01/13/22 ? ?[place] BUNIONECTOMY RIGHT --- [phone] ? ?[employer] EFFECTIVE DATE - 12/08/21 ? ?PLAN DEDUCTIBLE - $0.00 ?OUT OF POCKET - $8,300.00  W/ $8,300.00  REMAINING ?COINSURANCE - 20% ?COPAY - $0.00 ? ?PER [place] [id number] Notification or Prior Authorization is not required for the requested services ? ?This [employer] Medicare Advantage members plan does not currently require a prior authorization for these services. If you have general questions about the prior authorization requirements, please call us at [phone] or visit [email or web address] > Clinician Resources > Advance and Admission Notification Requirements. The number above acknowledges your notification. Please write this number down for future reference. Notification is not a guarantee of coverage or payment. ? ?Decision ID #:[id number] ?

## 2021-12-31 ENCOUNTER — Ambulatory Visit
Admission: EM | Admit: 2021-12-31 | Discharge: 2021-12-31 | Disposition: A | Discharge status: Home / Self Care | Payer: Medicare Other | Attending: Emergency Medicine | Admitting: Emergency Medicine

## 2021-12-31 ENCOUNTER — Encounter: Payer: Self-pay | Admitting: Emergency Medicine

## 2021-12-31 DIAGNOSIS — J329 Chronic sinusitis, unspecified: Secondary | ICD-10-CM

## 2021-12-31 DIAGNOSIS — J310 Chronic rhinitis: Secondary | ICD-10-CM

## 2021-12-31 DIAGNOSIS — J45901 Unspecified asthma with (acute) exacerbation: Secondary | ICD-10-CM | POA: Diagnosis not present

## 2021-12-31 DIAGNOSIS — J302 Other seasonal allergic rhinitis: Secondary | ICD-10-CM

## 2021-12-31 MED ORDER — FLUTICASONE PROPIONATE 50 MCG/ACT NA SUSP: 1.0000 | Freq: Every day | NASAL | 1 refills | Status: DC

## 2021-12-31 MED ORDER — ALBUTEROL SULFATE HFA 108 (90 BASE) MCG/ACT IN AERS: 2.0000 | INHALATION_SPRAY | Freq: Four times a day (QID) | RESPIRATORY_TRACT | 0 refills | Status: DC | PRN

## 2021-12-31 MED ORDER — FEXOFENADINE HCL 180 MG PO TABS: 180.0000 mg | ORAL_TABLET | Freq: Every day | ORAL | 1 refills | Status: DC

## 2021-12-31 MED ORDER — DEXAMETHASONE SODIUM PHOSPHATE 10 MG/ML IJ SOLN
20.0000 mg | Freq: Once | INTRAMUSCULAR | Status: AC
  Administered 2021-12-31: 20 mg via INTRAMUSCULAR

## 2021-12-31 MED ORDER — FLUTICASONE PROPIONATE 50 MCG/ACT NA SUSP: 1.0000 | Freq: Every day | NASAL | 1 refills | Status: AC

## 2021-12-31 MED ORDER — IPRATROPIUM BROMIDE 0.06 % NA SOLN: 2.0000 | Freq: Three times a day (TID) | NASAL | 1 refills | Status: DC

## 2021-12-31 NOTE — ED Triage Notes (Signed)
Pt here with dry cough, chest and nasal congestion x 5 days.

## 2021-12-31 NOTE — Discharge Instructions (Signed)
Your symptoms and my physical exam findings are concerning for exacerbation of your underlying allergies.  It is important that you begin your allergy regimen now and are consistent with taking allergy medications exactly as prescribed.  Allergy medications are preventative and therefore only work well when they are taken daily, not "as needed".   Please see the list below for recommended medications, dosages and frequencies to provide relief of current symptoms:     Decadron IM (dexamethasone):  To quickly address your significant respiratory inflammation, you were provided with an injection of Decadron in the office today.  You should continue to feel the full benefit of the steroid for the next 12-24 hours.    Allegra (fexofenadine): This is an excellent second-generation antihistamine that helps to reduce respiratory inflammatory response to environmental allergens.  This medication is not known to cause daytime sleepiness so it can be taken in the daytime.  If you find that it does make you sleepy, please feel free to take it at bedtime.   Flonase (fluticasone): This is a steroid nasal spray that you use once daily, 1 spray in each nare.  This medication does not work well if you decide to use it only used as you feel you need to, it works best used on a daily basis.  After 3 to 5 days of use, you will notice significant reduction of the inflammation and mucus production that is currently being caused by exposure to allergens, whether seasonal or environmental.  The most common side effect of this medication is nosebleeds.  If you experience a nosebleed, please discontinue use for 1 week, then feel free to resume.  I have provided you with a prescription.    Atrovent (ipratropium): This is an excellent nasal decongestant spray I have added to your recommended nasal steroid that will not cause rebound congestion, please instill 2 sprays into each nare with each use.  Because nasal steroids can take  several days before they begin to provide full benefit, I recommend that you use this spray in addition to the nasal steroid prescribed for you.  Please use it after you have used your nasal steroid and repeat up to 4 times daily as needed.  I have provided you with a prescription for this medication.      ProAir, Ventolin, Proventil (albuterol): This inhaled medication contains a short acting beta agonist bronchodilator.  This medication works on the smooth muscle that opens and constricts of your airways by relaxing the muscle.  The result of relaxation of the smooth muscle is increased air movement and improved work of breathing.  This is a short acting medication that can be used every 4-6 hours as needed for increased work of breathing, shortness of breath, wheezing and excessive coughing.  I have provided you with a prescription.    If you find that you have not had improvement of your symptoms in the next 5 to 7 days, please follow-up with your primary care provider or return here to urgent care for repeat evaluation and further recommendations.   Thank you for visiting urgent care today.  We appreciate the opportunity to participate in your care.

## 2021-12-31 NOTE — ED Provider Notes (Signed)
[facility]    CSN: [id number] Arrival date & time: 12/31/21  3295    HISTORY   Chief Complaint  Patient presents with   Cough   Nasal Congestion   HPI Manuel Brady is a 66 y.o. male. Patient presents to urgent care complaining of cough, chest and nasal congestion for the past 5 days.  Patient endorses a history of allergies, states he is taken over-the-counter pain pill that he bought at the dollar store, does not know what is called.  Patient states not helping.  Patient states he feels that there is congestion in his chest.  Patient denies shortness of breath, fever, aches, chills, nausea, vomiting, diarrhea.  Patient denies known sick contacts.  Patient states no one at home is sick.  The history is provided by the patient.  Past Medical History:  Diagnosis Date   Anxiety    Arthritis    R hip    Depression    Hepatitis C DX 2005   partial [employer] study   Neuropathy    PTSD (post-traumatic stress disorder)    Patient Active Problem List   Diagnosis Date Noted   Low back pain without sciatica 06/10/2021   Paresthesia 04/01/2021   Peripheral neuropathy 04/01/2021   PTSD (post-traumatic stress disorder) 05/05/2018   Pain, dental 08/06/2015   Cold sore 08/06/2015   Incidental lung nodule, greater than or equal to 32m 06/12/2015   Bilateral renal cysts 06/12/2015   Liver fibrosis 05/14/2015   Allergic rhinitis 01/28/2015   Onychomycosis of toenail 01/28/2015   BPH (benign prostatic hypertrophy) 01/28/2015   Chronic hepatitis C without hepatic coma ([facility]    Past Surgical History:  Procedure Laterality Date   arm surgery Left 2003    plates in arm, arm caught in machine, crush injury    arm surgery     COLONOSCOPY N/A 03/05/2015   Procedure: COLONOSCOPY;  Surgeon: SDanie Binder MD;  Location: [facility];  Service: Endoscopy;  Laterality: N/A;  11:15 AM   KNEE SURGERY Left 2005     Home Medications    Prior to Admission medications   Medication  Sig Start Date End Date Taking? Authorizing Provider  lisinopril-hydrochlorothiazide (ZESTORETIC) 10-12.5 MG tablet Take 1 tablet by mouth daily. 04/20/19   WElsie Stain MD  LUMIGAN 0.01 % SOLN Place 1 drop into the left eye 2 (two) times daily. 01/02/21   [provider]  OLANZapine (ZYPREXA) 10 MG tablet Take 10 mg by mouth at bedtime.    [provider]  prazosin (MINIPRESS) 1 MG capsule Take by mouth. 08/23/20 08/23/21  [provider]  terbinafine (LAMISIL) 250 MG tablet Take 1 tablet (250 mg total) by mouth daily. 09/24/21   RWallene Huh DPM   Family History Family History  Problem Relation Age of Onset   Hypertension Mother    Cancer Father        lung   Colon cancer Neg Hx    Social History Social History   Tobacco Use   Smoking status: Former    Types: Cigarettes    Quit date: 01/08/2013    Years since quitting: 8.9   Smokeless tobacco: Never  Vaping Use   Vaping Use: Never used  Substance Use Topics   Alcohol use: No    Alcohol/week: 0.0 standard drinks    Comment: former   Drug use: No    Types: Cocaine    Comment: Last use two years ago (2014)  Allergies   Patient has no known allergies.  Review of Systems Review of Systems  Respiratory:  Positive for cough.   Pertinent findings noted in history of present illness.   Physical Exam Triage Vital Signs ED Triage Vitals  Enc Vitals Group     BP 06/06/21 0827 (!) 147/82     Pulse Rate 06/06/21 0827 72     Resp 06/06/21 0827 18     Temp 06/06/21 0827 98.3 F (36.8 C)     Temp Source 06/06/21 0827 Oral     SpO2 06/06/21 0827 98 %     Weight --      Height --      Head Circumference --      Peak Flow --      Pain Score 06/06/21 0826 5     Pain Loc --      Pain Edu? --      Excl. in [place]? --   No data found.  Updated Vital Signs BP 136/88   Pulse 83   Temp 97.7 F (36.5 C)   Resp 20   SpO2 96%   Physical Exam Vitals and nursing note reviewed.  Constitutional:       General: He is not in acute distress.    Appearance: Normal appearance. He is not ill-appearing.  HENT:     Head: Normocephalic and atraumatic.     Salivary Glands: Right salivary gland is not diffusely enlarged or tender. Left salivary gland is not diffusely enlarged or tender.     Right Ear: Ear canal and external ear normal. No drainage. A middle ear effusion is present. There is no impacted cerumen. Tympanic membrane is bulging. Tympanic membrane is not injected or erythematous.     Left Ear: Ear canal and external ear normal. No drainage. A middle ear effusion is present. There is no impacted cerumen. Tympanic membrane is bulging. Tympanic membrane is not injected or erythematous.     Ears:     Comments: Bilateral EACs normal, both TMs bulging with clear fluid    Nose: Rhinorrhea present. No nasal deformity, septal deviation, signs of injury, nasal tenderness, mucosal edema or congestion. Rhinorrhea is clear.     Right Nostril: Occlusion present. No foreign body, epistaxis or septal hematoma.     Left Nostril: Occlusion present. No foreign body, epistaxis or septal hematoma.     Right Turbinates: Enlarged, swollen and pale.     Left Turbinates: Enlarged, swollen and pale.     Right Sinus: No maxillary sinus tenderness or frontal sinus tenderness.     Left Sinus: No maxillary sinus tenderness or frontal sinus tenderness.     Mouth/Throat:     Lips: Pink. No lesions.     Mouth: Mucous membranes are moist. No oral lesions.     Pharynx: Oropharynx is clear. Uvula midline. No posterior oropharyngeal erythema or uvula swelling.     Tonsils: No tonsillar exudate. 0 on the right. 0 on the left.     Comments: Postnasal drip Eyes:     General: Lids are normal.        Right eye: No discharge.        Left eye: No discharge.     Extraocular Movements: Extraocular movements intact.     Conjunctiva/sclera: Conjunctivae normal.     Right eye: Right conjunctiva is not injected.     Left eye: Left  conjunctiva is not injected.  Neck:     Trachea: Trachea and phonation normal.  Cardiovascular:     Rate and Rhythm: Normal rate and regular rhythm.     Pulses: Normal pulses.     Heart sounds: Normal heart sounds. No murmur heard.   No friction rub. No gallop.  Pulmonary:     Effort: Pulmonary effort is normal. No accessory muscle usage, prolonged expiration or respiratory distress.     Breath sounds: Normal breath sounds. No stridor, decreased air movement or transmitted upper airway sounds. No decreased breath sounds, wheezing, rhonchi or rales.  Chest:     Chest wall: No tenderness.  Musculoskeletal:        General: Normal range of motion.     Cervical back: Normal range of motion and neck supple. Normal range of motion.  Lymphadenopathy:     Cervical: No cervical adenopathy.  Skin:    General: Skin is warm and dry.     Findings: No erythema or rash.  Neurological:     General: No focal deficit present.     Mental Status: He is alert and oriented to person, place, and time.  Psychiatric:        Mood and Affect: Mood normal.        Behavior: Behavior normal.    Visual Acuity Right Eye Distance:   Left Eye Distance:   Bilateral Distance:    Right Eye Near:   Left Eye Near:    Bilateral Near:     [facility] Couse / Diagnostics / Procedures:    EKG  Radiology No results found.  Procedures Procedures (including critical care time)  UC Diagnoses / Final Clinical Impressions(s)   I have reviewed the triage vital signs and the nursing notes.  Pertinent labs & imaging results that were available during my care of the patient were reviewed by me and considered in my medical decision making (see chart for details).   Final diagnoses:  Seasonal allergic rhinitis, unspecified trigger  Rhinosinusitis  Allergic bronchitis with acute exacerbation   Patient advised that antibiotics were not indicated at this time due to lack of evidence of bacterial infection.  Patient advised to  begin albuterol, Allegra, Flonase and Atrovent nasal spray.  Also provided with a dexamethasone injection during his visit to significantly calm his airways.  Return precautions advised.  Medications were initially sent to the wrong pharmacy and had to be reordered.  ED Prescriptions     Medication Sig Dispense Auth. Provider   fexofenadine (ALLEGRA) 180 MG tablet  (Status: Discontinued) Take 1 tablet (180 mg total) by mouth daily. 90 tablet Lynden Oxford Scales, PA-C   fluticasone (FLONASE) 50 MCG/ACT nasal spray  (Status: Discontinued) Place 1 spray into both nostrils daily. Begin by using 2 sprays in each nare daily for 3 to 5 days, then decrease to 1 spray in each nare daily. 32 mL Lynden Oxford Scales, PA-C   ipratropium (ATROVENT) 0.06 % nasal spray  (Status: Discontinued) Place 2 sprays into both nostrils 3 (three) times daily. As needed for nasal congestion, runny nose 15 mL Lynden Oxford Scales, PA-C   albuterol (VENTOLIN HFA) 108 (90 Base) MCG/ACT inhaler  (Status: Discontinued) Inhale 2 puffs into the lungs every 6 (six) hours as needed for wheezing or shortness of breath (Cough). 18 g Lynden Oxford Scales, PA-C   albuterol (VENTOLIN HFA) 108 (90 Base) MCG/ACT inhaler Inhale 2 puffs into the lungs every 6 (six) hours as needed for wheezing or shortness of breath (Cough). 18 g Lynden Oxford Scales, PA-C   fexofenadine (ALLEGRA) 180 MG tablet Take  1 tablet (180 mg total) by mouth daily. 90 tablet Lynden Oxford Scales, PA-C   fluticasone (FLONASE) 50 MCG/ACT nasal spray Place 1 spray into both nostrils daily. Begin by using 2 sprays in each nare daily for 3 to 5 days, then decrease to 1 spray in each nare daily. 32 mL Lynden Oxford Scales, PA-C   ipratropium (ATROVENT) 0.06 % nasal spray Place 2 sprays into both nostrils 3 (three) times daily. As needed for nasal congestion, runny nose 15 mL Lynden Oxford Scales, PA-C      PDMP not reviewed this encounter.  Pending results:   Labs Reviewed - No data to display  Medications Ordered in UC: Medications  dexamethasone (DECADRON) injection 20 mg (20 mg Intramuscular Given 12/31/21 0836)    Disposition Upon Discharge:  Condition: stable for discharge home Home: take medications as prescribed; routine discharge instructions as discussed; follow up as advised.  Patient presented with an acute illness with associated systemic symptoms and significant discomfort requiring urgent management. In my opinion, this is a condition that a prudent lay person (someone who possesses an average knowledge of health and medicine) may potentially expect to result in complications if not addressed urgently such as respiratory distress, impairment of bodily function or dysfunction of bodily organs.   Routine symptom specific, illness specific and/or disease specific instructions were discussed with the patient and/or caregiver at length.   As such, the patient has been evaluated and assessed, work-up was performed and treatment was provided in alignment with urgent care protocols and evidence based medicine.  Patient/parent/caregiver has been advised that the patient may require follow up for further testing and treatment if the symptoms continue in spite of treatment, as clinically indicated and appropriate.  If the patient was tested for COVID-19, Influenza and/or RSV, then the patient/parent/guardian was advised to isolate at home pending the results of his/her diagnostic coronavirus test and potentially longer if they're positive. I have also advised pt that if his/her COVID-19 test returns positive, it's recommended to self-isolate for at least 10 days after symptoms first appeared AND until fever-free for 24 hours without fever reducer AND other symptoms have improved or resolved. Discussed self-isolation recommendations as well as instructions for household member/close contacts as per the [facility] and [place] DHHS, and also gave patient the [place]  packet with this information.  Patient/parent/caregiver has been advised to return to the [facility] or PCP in 3-5 days if no better; to PCP or the Emergency Department if new signs and symptoms develop, or if the current signs or symptoms continue to change or worsen for further workup, evaluation and treatment as clinically indicated and appropriate  The patient will follow up with their current PCP if and as advised. If the patient does not currently have a PCP we will assist them in obtaining one.   The patient may need specialty follow up if the symptoms continue, in spite of conservative treatment and management, for further workup, evaluation, consultation and treatment as clinically indicated and appropriate.  Patient/parent/caregiver verbalized understanding and agreement of plan as discussed.  All questions were addressed during visit.  Please see discharge instructions below for further details of plan.  Discharge Instructions:   Discharge Instructions      Your symptoms and my physical exam findings are concerning for exacerbation of your underlying allergies.  It is important that you begin your allergy regimen now and are consistent with taking allergy medications exactly as prescribed.  Allergy medications are preventative and therefore only work  well when they are taken daily, not "as needed".   Please see the list below for recommended medications, dosages and frequencies to provide relief of current symptoms:     Decadron IM (dexamethasone):  To quickly address your significant respiratory inflammation, you were provided with an injection of Decadron in the office today.  You should continue to feel the full benefit of the steroid for the next 12-24 hours.    Allegra (fexofenadine): This is an excellent second-generation antihistamine that helps to reduce respiratory inflammatory response to environmental allergens.  This medication is not known to cause daytime sleepiness so it can  be taken in the daytime.  If you find that it does make you sleepy, please feel free to take it at bedtime.   Flonase (fluticasone): This is a steroid nasal spray that you use once daily, 1 spray in each nare.  This medication does not work well if you decide to use it only used as you feel you need to, it works best used on a daily basis.  After 3 to 5 days of use, you will notice significant reduction of the inflammation and mucus production that is currently being caused by exposure to allergens, whether seasonal or environmental.  The most common side effect of this medication is nosebleeds.  If you experience a nosebleed, please discontinue use for 1 week, then feel free to resume.  I have provided you with a prescription.    Atrovent (ipratropium): This is an excellent nasal decongestant spray I have added to your recommended nasal steroid that will not cause rebound congestion, please instill 2 sprays into each nare with each use.  Because nasal steroids can take several days before they begin to provide full benefit, I recommend that you use this spray in addition to the nasal steroid prescribed for you.  Please use it after you have used your nasal steroid and repeat up to 4 times daily as needed.  I have provided you with a prescription for this medication.      ProAir, Ventolin, Proventil (albuterol): This inhaled medication contains a short acting beta agonist bronchodilator.  This medication works on the smooth muscle that opens and constricts of your airways by relaxing the muscle.  The result of relaxation of the smooth muscle is increased air movement and improved work of breathing.  This is a short acting medication that can be used every 4-6 hours as needed for increased work of breathing, shortness of breath, wheezing and excessive coughing.  I have provided you with a prescription.    If you find that you have not had improvement of your symptoms in the next 5 to 7 days, please follow-up  with your primary care provider or return here to urgent care for repeat evaluation and further recommendations.   Thank you for visiting urgent care today.  We appreciate the opportunity to participate in your care.       This office note has been dictated using Museum/gallery curator.  Unfortunately, and despite my best efforts, this method of dictation can sometimes lead to occasional typographical or grammatical errors.  I apologize in advance if this occurs.     Lynden Oxford Scales, PA-C 12/31/21 1225

## 2022-01-19 ENCOUNTER — Encounter: Payer: Medicare HMO | Admitting: Podiatry

## 2022-01-30 ENCOUNTER — Other Ambulatory Visit: Payer: Self-pay | Admitting: Podiatry

## 2022-03-04 ENCOUNTER — Telehealth: Payer: Self-pay | Admitting: Urology

## 2022-03-04 NOTE — Telephone Encounter (Signed)
DOS - 03/24/22  [place] [facility] RIGHT --- [phone]  [facility] EFFECTIVE DATE - 01/08/22  PLAN DEDUCTIBLE - $0.00 OUT OF POCKET - $8,300.00 W/ $8,097.45 REMAINING COINSURANCE - 20% COPAY - $0.00   PER [employer] WEBSITE FOR CPT CODE 79150 Notification or Prior Authorization is not required for the requested services  This [employer] Advant[employer] members plan does not currently require a prior authorization for these services. If you have general questions about the prior authorization requirements, please call us at [phone] or visit [email or web address] > Clinician Resources > Advance and Admission Notification Requirements. The number above acknowledges your notification. Please write this number down for future reference. Notification is not a guarantee of coverage or payment.  Decision ID #:[id number]

## 2022-03-12 ENCOUNTER — Telehealth: Payer: Self-pay

## 2022-03-12 NOTE — Telephone Encounter (Signed)
Manuel Brady called to cancel his surgery with Dr. Paulla Dolly on 03/24/2022. He stated he will be moving and will call back to reschedule. Notified Dr. Paulla Dolly and Caren Griffins with [facility].

## 2022-03-30 ENCOUNTER — Encounter: Payer: Medicare HMO | Admitting: Podiatry

## 2022-04-11 ENCOUNTER — Ambulatory Visit (HOSPITAL_COMMUNITY)
Admission: EM | Admit: 2022-04-11 | Discharge: 2022-04-11 | Disposition: A | Discharge status: Home / Self Care | Payer: Medicare Other | Attending: Physician Assistant | Admitting: Physician Assistant

## 2022-04-11 ENCOUNTER — Encounter (HOSPITAL_COMMUNITY): Payer: Self-pay

## 2022-04-11 DIAGNOSIS — J0110 Acute frontal sinusitis, unspecified: Secondary | ICD-10-CM | POA: Diagnosis not present

## 2022-04-11 DIAGNOSIS — K0889 Other specified disorders of teeth and supporting structures: Secondary | ICD-10-CM

## 2022-04-11 DIAGNOSIS — K047 Periapical abscess without sinus: Secondary | ICD-10-CM

## 2022-04-11 MED ORDER — PENICILLIN V POTASSIUM 500 MG PO TABS: 500.0000 mg | ORAL_TABLET | Freq: Four times a day (QID) | ORAL | 0 refills | Status: AC

## 2022-04-11 NOTE — ED Provider Notes (Signed)
[place]    CSN: [id number] Arrival date & time: 04/11/22  1229      History   Chief Complaint Chief Complaint  Patient presents with   Sinus Problem   Dental Pain    HPI Manuel Brady is a 66 y.o. male.   66 year old male presents with sinus congestion and left upper tooth pain.  Patient indicates that about a week ago he had a tooth extracted from the left upper incisor area.  He indicates since then he has been having increasing pain, discomfort, swelling, and mild drainage from the tooth extraction site.  He indicates he has been taking some ibuprofen which tends to help relieve his discomfort.  Patient also relates he has been having some increasing upper sinus congestion with pain mainly frontal and maxillary sinus areas.  He has been having rhinitis which is associated with purulent production.  He has noticed some intermittent mild off-balance sensation and bilateral ear congestion with fluid sensation.  Patient denies fever, chills, cough or congestion.  He has been using Flonase, and Mucinex which has helped to improve his sinus congestion.  He is concerned about the tooth being infected.   Sinus Problem  Dental Pain   Past Medical History:  Diagnosis Date   Anxiety    Arthritis    R hip    Depression    Hepatitis C DX 2005   partial [employer] study   Neuropathy    PTSD (post-traumatic stress disorder)     Patient Active Problem List   Diagnosis Date Noted   Low back pain without sciatica 06/10/2021   Paresthesia 04/01/2021   Peripheral neuropathy 04/01/2021   PTSD (post-traumatic stress disorder) 05/05/2018   Pain, dental 08/06/2015   Cold sore 08/06/2015   Incidental lung nodule, greater than or equal to 79m 06/12/2015   Bilateral renal cysts 06/12/2015   Liver fibrosis 05/14/2015   Allergic rhinitis 01/28/2015   Onychomycosis of toenail 01/28/2015   BPH (benign prostatic hypertrophy) 01/28/2015   Chronic hepatitis C without hepatic coma  ([facility]     Past Surgical History:  Procedure Laterality Date   arm surgery Left 2003    plates in arm, arm caught in machine, crush injury    arm surgery     COLONOSCOPY N/A 03/05/2015   Procedure: COLONOSCOPY;  Surgeon: SDanie Binder MD;  [facility];  Service: Endoscopy;  Laterality: N/A;  11:15 AM   KNEE SURGERY Left 2005        Home Medications    Prior to Admission medications   Medication Sig Start Date End Date Taking? Authorizing Provider  lisinopril-hydrochlorothiazide (ZESTORETIC) 10-12.5 MG tablet Take 1 tablet by mouth daily. 04/20/19  Yes WElsie Stain MD  penicillin v potassium (VEETID) 500 MG tablet Take 1 tablet (500 mg total) by mouth 4 (four) times daily for 10 days. 04/11/22 04/21/22 Yes JNyoka Lint PA-C  albuterol (VENTOLIN HFA) 108 (90 Base) MCG/ACT inhaler Inhale 2 puffs into the lungs every 6 (six) hours as needed for wheezing or shortness of breath (Cough). 12/31/21   MLynden OxfordScales, PA-C  fexofenadine (ALLEGRA) 180 MG tablet Take 1 tablet (180 mg total) by mouth daily. 12/31/21 06/29/22  MLynden OxfordScales, PA-C  fluticasone (FLONASE) 50 MCG/ACT nasal spray Place 1 spray into both nostrils daily. Begin by using 2 sprays in each nare daily for 3 to 5 days, then decrease to 1 spray in each nare daily. 12/31/21   MLynden OxfordScales, PA-C  ipratropium (ATROVENT) 0.06 % nasal spray Place 2 sprays into both nostrils 3 (three) times daily. As needed for nasal congestion, runny nose 12/31/21   Lynden Oxford Scales, PA-C  LUMIGAN 0.01 % SOLN Place 1 drop into the left eye 2 (two) times daily. 01/02/21   [provider]  OLANZapine (ZYPREXA) 10 MG tablet Take 10 mg by mouth at bedtime.    [provider]  prazosin (MINIPRESS) 1 MG capsule Take by mouth. 08/23/20 08/23/21  [provider]  terbinafine (LAMISIL) 250 MG tablet Take 1 tablet (250 mg total) by mouth daily. 09/24/21   Wallene Huh, DPM    Family  History Family History  Problem Relation Age of Onset   Hypertension Mother    Cancer Father        lung   Colon cancer Neg Hx     Social History Social History   Tobacco Use   Smoking status: Former    Types: Cigarettes    Quit date: 01/08/2013    Years since quitting: 9.2   Smokeless tobacco: Never  Vaping Use   Vaping Use: Never used  Substance Use Topics   Alcohol use: No    Alcohol/week: 0.0 standard drinks of alcohol    Comment: former   Drug use: No    Types: Cocaine    Comment: Last use two years ago (2014)     Allergies   Patient has no known allergies.   Review of Systems Review of Systems  HENT:  Positive for dental problem (left upper incisor), sinus pressure and sinus pain.      Physical Exam Triage Vital Signs ED Triage Vitals  Enc Vitals Group     BP 04/11/22 1319 130/71     Pulse Rate 04/11/22 1319 77     Resp 04/11/22 1319 16     Temp 04/11/22 1319 98.5 F (36.9 C)     Temp Source 04/11/22 1319 Oral     SpO2 04/11/22 1319 98 %     Weight --      Height --      Head Circumference --      Peak Flow --      Pain Score 04/11/22 1321 9     Pain Loc --      Pain Edu? --      Excl. in [place]? --    No data found.  Updated Vital Signs BP 130/71 (BP Location: Right Arm)   Pulse 77   Temp 98.5 F (36.9 C) (Oral)   Resp 16   SpO2 98%   Visual Acuity Right Eye Distance:   Left Eye Distance:   Bilateral Distance:    Right Eye Near:   Left Eye Near:    Bilateral Near:     Physical Exam Constitutional:      Appearance: Normal appearance.  HENT:     Right Ear: Ear canal normal. Tympanic membrane is injected.     Left Ear: Ear canal normal. Tympanic membrane is injected.     Mouth/Throat:     Mouth: Mucous membranes are moist.     Pharynx: Oropharynx is clear. Uvula midline. No pharyngeal swelling.     Comments: Sinus: Tenderness is palpated along the left frontal and maxillary sinus areas without redness or swelling. Mouth: Left  upper incisor area where the tooth has been removed there is mild redness and swelling along the gumline. Cardiovascular:     Rate and Rhythm: Normal rate and regular rhythm.  Heart sounds: Normal heart sounds.  Pulmonary:     Effort: Pulmonary effort is normal.     Breath sounds: Normal breath sounds and air entry. No wheezing, rhonchi or rales.  Neurological:     Mental Status: He is alert.      UC Treatments / Results  Labs (all labs ordered are listed, but only abnormal results are displayed) Labs Reviewed - No data to display  EKG   Radiology No results found.  Procedures Procedures (including critical care time)  Medications Ordered in UC Medications - No data to display  Initial Impression / Assessment and Plan / UC Course  I have reviewed the triage vital signs and the nursing notes.  Pertinent labs & imaging results that were available during my care of the patient were reviewed by me and considered in my medical decision making (see chart for details).    Plan: 1.  Advised to continue using the Flonase nasal spray 2 sprays each nostril once a day to help decrease the sinus congestion. 2.  Advised to continue with the Mucinex to help decrease sinus congestion. 3.  Advised to take ibuprofen to help reduce the pain of the sinuses and the tooth. 4.  Advised take the Pen-Vee K 500 mg every 6 hours till completed to treat the tooth abscess formation and also sinus infection. 5.  Advised follow-up PCP return to urgent care if symptoms fail to improve Final Clinical Impressions(s) / UC Diagnoses   Final diagnoses:  Acute non-recurrent frontal sinusitis  Pain, dental  Dental infection     Discharge Instructions      Advised to continue using Mucinex for the sinus congestion. Advised to continue using Flonase nasal spray 2 sprays each nostril once a day to help decrease the sinus inflammation to help drainage. Advised to start taking the penicillin VK 500 mg  1 every 6 hours until completed as this will treat the tooth and the sinus infection both. Advised follow-up PCP or return to urgent care as needed.    ED Prescriptions     Medication Sig Dispense Auth. Provider   penicillin v potassium (VEETID) 500 MG tablet Take 1 tablet (500 mg total) by mouth 4 (four) times daily for 10 days. 40 tablet Nyoka Lint, PA-C      PDMP not reviewed this encounter.   Nyoka Lint, PA-C 04/11/22 1342

## 2022-04-11 NOTE — Discharge Instructions (Addendum)
Advised to continue using Mucinex for the sinus congestion. Advised to continue using Flonase nasal spray 2 sprays each nostril once a day to help decrease the sinus inflammation to help drainage. Advised to start taking the penicillin VK 500 mg 1 every 6 hours until completed as this will treat the tooth and the sinus infection both. Advised follow-up PCP or return to urgent care as needed.

## 2022-04-11 NOTE — ED Triage Notes (Signed)
Sinus symptoms for 3 days. Facial pain, nasal drainage that is light green. Patient has history of chronic sinus infections. No known sick exposure, no one around the Patient with similar symptoms.   Tooth was pulled 2 weeks ago. Patient still having pain. Drainage from where the tooth is pulled but unknown color. On the left side of the mouth top.

## 2022-12-17 ENCOUNTER — Encounter: Payer: Self-pay | Admitting: Podiatry

## 2022-12-17 ENCOUNTER — Ambulatory Visit (INDEPENDENT_AMBULATORY_CARE_PROVIDER_SITE_OTHER): Payer: 59 | Admitting: Podiatry

## 2022-12-17 ENCOUNTER — Ambulatory Visit (INDEPENDENT_AMBULATORY_CARE_PROVIDER_SITE_OTHER): Payer: 59

## 2022-12-17 DIAGNOSIS — M79671 Pain in right foot: Secondary | ICD-10-CM | POA: Diagnosis not present

## 2022-12-17 DIAGNOSIS — M21619 Bunion of unspecified foot: Secondary | ICD-10-CM

## 2022-12-17 DIAGNOSIS — M7672 Peroneal tendinitis, left leg: Secondary | ICD-10-CM

## 2022-12-17 DIAGNOSIS — M79672 Pain in left foot: Secondary | ICD-10-CM

## 2022-12-17 MED ORDER — TRIAMCINOLONE ACETONIDE 10 MG/ML IJ SUSP: 10.0000 mg | Freq: Once | INTRAMUSCULAR | Status: DC

## 2022-12-17 NOTE — Progress Notes (Signed)
Subjective:   Patient ID: Manuel Brady, male   DOB: 67 y.o.   MRN: [id number]   HPI Patient presents stating that he is ready to get this right bunion fixed he is also having knee replacement but would like to do this first but needs to wait till the end of summer but wants to review today.  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  For the left I did do careful sterile prep I injected the peroneal fifth metatarsal base insertion after explaining risk 3 mg dexamethasone Kenalog 5 mg Xylocaine and will be seen back as needed for that  X-rays indicate significant structural bunion deformity right deviation of the hallux approximate 15 degree IM angle with the left also showing the same deformity with the same IM angle

## 2023-01-10 ENCOUNTER — Encounter (HOSPITAL_COMMUNITY): Payer: Self-pay | Admitting: Emergency Medicine

## 2023-01-10 ENCOUNTER — Ambulatory Visit (HOSPITAL_COMMUNITY)
Admission: EM | Admit: 2023-01-10 | Discharge: 2023-01-10 | Disposition: A | Discharge status: Home / Self Care | Payer: 59 | Attending: Emergency Medicine | Admitting: Emergency Medicine

## 2023-01-10 ENCOUNTER — Other Ambulatory Visit: Payer: Self-pay

## 2023-01-10 DIAGNOSIS — N390 Urinary tract infection, site not specified: Secondary | ICD-10-CM | POA: Diagnosis present

## 2023-01-10 DIAGNOSIS — H6122 Impacted cerumen, left ear: Secondary | ICD-10-CM | POA: Insufficient documentation

## 2023-01-10 LAB — POCT URINALYSIS DIP (MANUAL ENTRY)
Bilirubin, UA: NEGATIVE
Glucose, UA: NEGATIVE mg/dL
Ketones, POC UA: NEGATIVE mg/dL
Nitrite, UA: NEGATIVE
Protein Ur, POC: NEGATIVE mg/dL
Spec Grav, UA: <=1.005 — AB (ref 1.010–1.025)
Urobilinogen, UA: 0.2 E.U./dL
pH, UA: 5.5 (ref 5.0–8.0)

## 2023-01-10 MED ORDER — NITROFURANTOIN MONOHYD MACRO 100 MG PO CAPS: 100.0000 mg | ORAL_CAPSULE | Freq: Two times a day (BID) | ORAL | 0 refills | Status: AC

## 2023-01-10 NOTE — ED Provider Notes (Signed)
[facility]    CSN: [id number] Arrival date & time: 01/10/23  1052      History   Chief Complaint Chief Complaint  Patient presents with   Otalgia   Urinary Tract Infection    HPI Manuel Brady is a 67 y.o. male.  Here with 2-day history of bilateral ear pain, mild.  Denies any drainage or hearing changes.  No fever He had a lot of wax in the right ear he removed  Also noticed dysuria x 2 days. He started drinking lots of water today. No NVD or flank pain. Denies hematuria, frequency, urgency.  No medications have been taken for either symptom  Hx chronic sinusitis, has surgery scheduled with ENT this week Several courses of oral antibiotics prescribed for this, most recently in March 10-day clindamycin   Past Medical History:  Diagnosis Date   Anxiety    Arthritis    R hip    Depression    Hepatitis C DX 2005   partial [employer] study   Neuropathy    PTSD (post-traumatic stress disorder)     Patient Active Problem List   Diagnosis Date Noted   Low back pain without sciatica 06/10/2021   Paresthesia 04/01/2021   Peripheral neuropathy 04/01/2021   PTSD (post-traumatic stress disorder) 05/05/2018   Pain, dental 08/06/2015   Cold sore 08/06/2015   Incidental lung nodule, greater than or equal to 8mm 06/12/2015   Bilateral renal cysts 06/12/2015   Liver fibrosis 05/14/2015   Allergic rhinitis 01/28/2015   Onychomycosis of toenail 01/28/2015   BPH (benign prostatic hypertrophy) 01/28/2015   Chronic hepatitis C without hepatic coma [facility])     Past Surgical History:  Procedure Laterality Date   arm surgery Left 2003    plates in arm, arm caught in machine, crush injury    arm surgery     COLONOSCOPY N/A 03/05/2015   Procedure: COLONOSCOPY;  Surgeon: West Bali, MD;  Location[facility];  Service: Endoscopy;  Laterality: N/A;  11:15 AM   KNEE SURGERY Left 2005        Home Medications    Prior to Admission medications   Medication Sig  Start Date End Date Taking? Authorizing Provider  gabapentin (NEURONTIN) 300 MG capsule  12/05/22  Yes [provider]  nitrofurantoin, macrocrystal-monohydrate, (MACROBID) 100 MG capsule Take 1 capsule (100 mg total) by mouth 2 (two) times daily for 7 days. 01/10/23 01/17/23 Yes Doyle Tegethoff, Lurena Joiner, PA-C  albuterol (VENTOLIN HFA) 108 (90 Base) MCG/ACT inhaler Inhale 2 puffs into the lungs every 6 (six) hours as needed for wheezing or shortness of breath (Cough). 12/31/21   Theadora Rama Scales, PA-C  fexofenadine (ALLEGRA) 180 MG tablet Take 1 tablet (180 mg total) by mouth daily. 12/31/21 06/29/22  Theadora Rama Scales, PA-C  fluticasone (FLONASE) 50 MCG/ACT nasal spray Place 1 spray into both nostrils daily. Begin by using 2 sprays in each nare daily for 3 to 5 days, then decrease to 1 spray in each nare daily. 12/31/21   Theadora Rama Scales, PA-C  ipratropium (ATROVENT) 0.06 % nasal spray Place 2 sprays into both nostrils 3 (three) times daily. As needed for nasal congestion, runny nose 12/31/21   Theadora Rama Scales, PA-C  lisinopril-hydrochlorothiazide (ZESTORETIC) 10-12.5 MG tablet Take 1 tablet by mouth daily. 04/20/19   Storm Frisk, MD    Family History Family History  Problem Relation Age of Onset   Hypertension Mother    Cancer Father  lung   Colon cancer Neg Hx     Social History Social History   Tobacco Use   Smoking status: Former    Types: Cigarettes    Quit date: 01/08/2013    Years since quitting: 10.0   Smokeless tobacco: Never  Vaping Use   Vaping Use: Never used  Substance Use Topics   Alcohol use: No    Alcohol/week: 0.0 standard drinks of alcohol    Comment: former   Drug use: No    Types: Cocaine    Comment: Last use two years ago (2014)     Allergies   Patient has no known allergies.   Review of Systems Review of Systems As per HPI  Physical Exam Triage Vital Signs ED Triage Vitals  Enc Vitals Group     BP 01/10/23 1221 (!)  143/86     Pulse Rate 01/10/23 1221 63     Resp 01/10/23 1221 20     Temp 01/10/23 1221 98.3 F (36.8 C)     Temp Source 01/10/23 1221 Oral     SpO2 01/10/23 1221 98 %     Weight --      Height --      Head Circumference --      Peak Flow --      Pain Score 01/10/23 1218 0     Pain Loc --      Pain Edu? --      Excl. in [place]? --    No data found.  Updated Vital Signs BP (!) 143/86 (BP Location: Left Arm) Comment (BP Location): large cuff  Pulse 63   Temp 98.3 F (36.8 C) (Oral)   Resp 20   SpO2 98%    Physical Exam Vitals and nursing note reviewed.  Constitutional:      Appearance: Normal appearance.  HENT:     Right Ear: Tympanic membrane and ear canal normal.     Ears:     Comments: Hard wax in the left ear, cannot visualize TM    Mouth/Throat:     Mouth: Mucous membranes are moist.     Pharynx: Oropharynx is clear.  Eyes:     Conjunctiva/sclera: Conjunctivae normal.  Cardiovascular:     Rate and Rhythm: Normal rate and regular rhythm.     Heart sounds: Normal heart sounds.  Pulmonary:     Effort: Pulmonary effort is normal.     Breath sounds: Normal breath sounds.  Abdominal:     General: Bowel sounds are normal.     Palpations: Abdomen is soft.     Tenderness: There is no abdominal tenderness. There is no right CVA tenderness, left CVA tenderness, guarding or rebound.  Musculoskeletal:        General: Normal range of motion.  Skin:    General: Skin is warm and dry.  Neurological:     Mental Status: He is alert and oriented to person, place, and time.     UC Treatments / Results  Labs (all labs ordered are listed, but only abnormal results are displayed) Labs Reviewed  POCT URINALYSIS DIP (MANUAL ENTRY) - Abnormal; Notable for the following components:      Result Value   Spec Grav, UA <=1.005 (*)    Blood, UA trace-intact (*)    Leukocytes, UA Trace (*)    All other components within normal limits  URINE CULTURE    EKG   Radiology No  results found.  Procedures Procedures (including critical care time)  Medications Ordered in UC Medications - No data to display  Initial Impression / Assessment and Plan / UC Course  I have reviewed the triage vital signs and the nursing notes.  Pertinent labs & imaging results that were available during my care of the patient were reviewed by me and considered in my medical decision making (see chart for details).  Afebrile, well appearing UA trace RBC and trace leuks, although very dilute sample (low spec grav). Culture pending. Good kidney function, GFR > 60. Several antibiotics in the last few months and would like to use something milder than a FQ. Low concern for pyelo/ascending infection or prostate etiology.  Treat for acute cystitis with macrobid BID x 7  Left ear irrigation successful, wax removed. No sign of infection Has follow up with his ENT in 2 days  UC return and ED precautions discussed  Final Clinical Impressions(s) / UC Diagnoses   Final diagnoses:  Lower urinary tract infectious disease  Excessive ear wax, left     Discharge Instructions      Please take medication as prescribed. Take with food to avoid upset stomach. Finish the full course! We will call you if any changes are needed based on your urine culture. Continue hydrating  Please follow-up with your ENT at your visit next     ED Prescriptions     Medication Sig Dispense Auth. Provider   nitrofurantoin, macrocrystal-monohydrate, (MACROBID) 100 MG capsule Take 1 capsule (100 mg total) by mouth 2 (two) times daily for 7 days. 14 capsule Grisel Blumenstock, Lurena Joiner, PA-C      PDMP not reviewed this encounter.   Ismahan Lippman, Ray Church 01/10/23 1328

## 2023-01-10 NOTE — Discharge Instructions (Addendum)
Please take medication as prescribed. Take with food to avoid upset stomach. Finish the full course! We will call you if any changes are needed based on your urine culture. Continue hydrating  Please follow-up with your ENT at your visit next

## 2023-01-10 NOTE — ED Triage Notes (Signed)
Bilateral ear pain for 2 days, chills and noticed burning with urination at end of stream.  Complains of lower back pain  Has not taken any medications for these symptoms

## 2023-01-12 ENCOUNTER — Other Ambulatory Visit: Payer: Self-pay | Admitting: Otolaryngology

## 2023-01-12 ENCOUNTER — Telehealth: Payer: Self-pay | Admitting: Emergency Medicine

## 2023-01-12 ENCOUNTER — Telehealth (HOSPITAL_COMMUNITY): Payer: Self-pay | Admitting: *Deleted

## 2023-01-12 LAB — URINE CULTURE: Culture: 40000 — AB

## 2023-01-12 MED ORDER — SULFAMETHOXAZOLE-TRIMETHOPRIM 800-160 MG PO TABS: 1.0000 | ORAL_TABLET | Freq: Two times a day (BID) | ORAL | 0 refills | Status: AC

## 2023-01-12 NOTE — Telephone Encounter (Signed)
Pt requested a return call about medication. No answer on [phone]

## 2023-01-23 ENCOUNTER — Ambulatory Visit (HOSPITAL_COMMUNITY)
Admission: EM | Admit: 2023-01-23 | Discharge: 2023-01-23 | Disposition: A | Discharge status: Home / Self Care | Payer: 59 | Attending: Physician Assistant | Admitting: Physician Assistant

## 2023-01-23 ENCOUNTER — Other Ambulatory Visit: Payer: Self-pay

## 2023-01-23 ENCOUNTER — Encounter (HOSPITAL_COMMUNITY): Payer: Self-pay | Admitting: *Deleted

## 2023-01-23 DIAGNOSIS — N451 Epididymitis: Secondary | ICD-10-CM

## 2023-01-23 LAB — POCT URINALYSIS DIP (MANUAL ENTRY)
Bilirubin, UA: NEGATIVE
Glucose, UA: NEGATIVE mg/dL
Ketones, POC UA: NEGATIVE mg/dL
Leukocytes, UA: NEGATIVE
Nitrite, UA: NEGATIVE
Protein Ur, POC: NEGATIVE mg/dL
Spec Grav, UA: <=1.005 — AB (ref 1.010–1.025)
Urobilinogen, UA: 0.2 E.U./dL
pH, UA: 6 (ref 5.0–8.0)

## 2023-01-23 MED ORDER — LEVOFLOXACIN 500 MG PO TABS: 500.0000 mg | ORAL_TABLET | Freq: Every day | ORAL | 0 refills | Status: DC

## 2023-01-23 NOTE — ED Provider Notes (Signed)
[facility]    CSN: [id number] Arrival date & time: 01/23/23  1659      History   Chief Complaint Chief Complaint  Patient presents with   UTI    HPI Manuel Brady is a 67 y.o. male.   Patient presents today with a several day history of worsening urinary symptoms.  Over the past few days he has developed left testicular pain with radiation into his groin.  He denies history of nephrolithiasis.  He does have a history of UTI and was last seen and treated on 01/10/2023.  At that point urine culture did grow Enterococcus areogenes.  He was initially sent home on Macrobid but sensitivities identified that this bacteria was intermediately resistant to this and so was switched to Bactrim.  He did complete this course of medication but never had complete resolution of symptoms.  He denies any recent catheterization but did have surgery for sinuses several weeks ago.  At that time he was prescribed steroids but no antibiotics.  He denies concern for STI and declined testing today.  He denies penile discharge or lesion.  He does not follow with a urologist.  He has tried ibuprofen with temporary improvement of pain.  Denies any fever, nausea, vomiting.  He denies history of diabetes or immunosuppression.  Does not take SGLT2 inhibitor.    Past Medical History:  Diagnosis Date   Anxiety    Arthritis    R hip    Depression    Hepatitis C DX 2005   partial [facility] study   Neuropathy    PTSD (post-traumatic stress disorder)     Patient Active Problem List   Diagnosis Date Noted   Low back pain without sciatica 06/10/2021   Paresthesia 04/01/2021   Peripheral neuropathy 04/01/2021   PTSD (post-traumatic stress disorder) 05/05/2018   Pain, dental 08/06/2015   Cold sore 08/06/2015   Incidental lung nodule, greater than or equal to 8mm 06/12/2015   Bilateral renal cysts 06/12/2015   Liver fibrosis 05/14/2015   Allergic rhinitis 01/28/2015   Onychomycosis of toenail 01/28/2015    BPH (benign prostatic hypertrophy) 01/28/2015   Chronic hepatitis C without hepatic coma [facility])     Past Surgical History:  Procedure Laterality Date   arm surgery Left 2003    plates in arm, arm caught in machine, crush injury    arm surgery     COLONOSCOPY N/A 03/05/2015   Procedure: COLONOSCOPY;  Surgeon: West Bali, MD;  [facility];  Service: Endoscopy;  Laterality: N/A;  11:15 AM   KNEE SURGERY Left 2005        Home Medications    Prior to Admission medications   Medication Sig Start Date End Date Taking? Authorizing Provider  albuterol (VENTOLIN HFA) 108 (90 Base) MCG/ACT inhaler Inhale 2 puffs into the lungs every 6 (six) hours as needed for wheezing or shortness of breath (Cough). 12/31/21  Yes Theadora Rama Scales, PA-C  fluticasone (FLONASE) 50 MCG/ACT nasal spray Place 1 spray into both nostrils daily. Begin by using 2 sprays in each nare daily for 3 to 5 days, then decrease to 1 spray in each nare daily. 12/31/21  Yes Theadora Rama Scales, PA-C  gabapentin (NEURONTIN) 300 MG capsule  12/05/22  Yes [provider]  ipratropium (ATROVENT) 0.06 % nasal spray Place 2 sprays into both nostrils 3 (three) times daily. As needed for nasal congestion, runny nose 12/31/21  Yes Theadora Rama Scales, PA-C  levofloxacin (LEVAQUIN) 500  MG tablet Take 1 tablet (500 mg total) by mouth daily. 01/23/23  Yes Jeanny Rymer K, PA-C  lisinopril-hydrochlorothiazide (ZESTORETIC) 10-12.5 MG tablet Take 1 tablet by mouth daily. 04/20/19  Yes Storm Frisk, MD  fexofenadine (ALLEGRA) 180 MG tablet Take 1 tablet (180 mg total) by mouth daily. 12/31/21 06/29/22  Theadora Rama Scales, PA-C    Family History Family History  Problem Relation Age of Onset   Hypertension Mother    Cancer Father        lung   Colon cancer Neg Hx     Social History Social History   Tobacco Use   Smoking status: Former    Types: Cigarettes    Quit date: 01/08/2013    Years since  quitting: 10.0   Smokeless tobacco: Never  Vaping Use   Vaping Use: Never used  Substance Use Topics   Alcohol use: No    Alcohol/week: 0.0 standard drinks of alcohol    Comment: former   Drug use: No    Types: Cocaine    Comment: Last use two years ago (2014)     Allergies   Patient has no known allergies.   Review of Systems Review of Systems  Constitutional:  Negative for activity change, appetite change, fatigue and fever.  Gastrointestinal:  Negative for abdominal pain, diarrhea, nausea and vomiting.  Genitourinary:  Positive for frequency and testicular pain. Negative for dysuria, hematuria, penile pain, penile swelling and urgency.  Musculoskeletal:  Negative for arthralgias and myalgias.     Physical Exam Triage Vital Signs ED Triage Vitals  Enc Vitals Group     BP 01/23/23 1719 117/75     Pulse Rate 01/23/23 1719 75     Resp 01/23/23 1719 18     Temp 01/23/23 1719 98 F (36.7 C)     Temp src --      SpO2 01/23/23 1719 95 %     Weight --      Height --      Head Circumference --      Peak Flow --      Pain Score 01/23/23 1716 7     Pain Loc --      Pain Edu? --      Excl. in GC? --    No data found.  Updated Vital Signs BP 117/75   Pulse 75   Temp 98 F (36.7 C)   Resp 18   SpO2 95%   Visual Acuity Right Eye Distance:   Left Eye Distance:   Bilateral Distance:    Right Eye Near:   Left Eye Near:    Bilateral Near:     Physical Exam Vitals reviewed.  Constitutional:      General: He is awake.     Appearance: Normal appearance. He is well-developed. He is not ill-appearing.     Comments: Very pleasant male appears stated age in no acute distress sitting comfortably in exam room  HENT:     Head: Normocephalic and atraumatic.     Mouth/Throat:     Pharynx: No oropharyngeal exudate, posterior oropharyngeal erythema or uvula swelling.  Cardiovascular:     Rate and Rhythm: Normal rate and regular rhythm.     Heart sounds: Normal heart  sounds, S1 normal and S2 normal. No murmur heard. Pulmonary:     Effort: Pulmonary effort is normal.     Breath sounds: Normal breath sounds. No stridor. No wheezing, rhonchi or rales.     Comments: Clear to  auscultation bilaterally Abdominal:     General: Bowel sounds are normal.     Palpations: Abdomen is soft.     Tenderness: There is no abdominal tenderness. There is no right CVA tenderness, left CVA tenderness, guarding or rebound.     Comments: Benign abdominal exam  Genitourinary:    Penis: Normal and circumcised.      Testes:        Right: Mass or tenderness not present.        Left: Tenderness present. Mass not present.     Epididymis:     Left: Not inflamed or enlarged. Tenderness present.     Comments: Magda Paganini, RN present as chaperone during exam. Neurological:     Mental Status: He is alert.  Psychiatric:        Behavior: Behavior is cooperative.      UC Treatments / Results  Labs (all labs ordered are listed, but only abnormal results are displayed) Labs Reviewed  POCT URINALYSIS DIP (MANUAL ENTRY) - Abnormal; Notable for the following components:      Result Value   Spec Grav, UA <=1.005 (*)    Blood, UA trace-intact (*)    All other components within normal limits  URINE CULTURE    EKG   Radiology No results found.  Procedures Procedures (including critical care time)  Medications Ordered in UC Medications - No data to display  Initial Impression / Assessment and Plan / UC Course  I have reviewed the triage vital signs and the nursing notes.  Pertinent labs & imaging results that were available during my care of the patient were reviewed by me and considered in my medical decision making (see chart for details).     Patient is well-appearing, afebrile, nontoxic, nontachycardic.  Concern for epididymitis given clinical presentation.  Patient denies any concern for STI so we will treat with fluoroquinolone.  He declined STI testing.  Urine had  trace blood but no leukocyte esterase.  Will send this for culture given recent UTI.  He was prescribed levofloxacin 500 mg daily for 10 days.  Denies history of chronic kidney disease so medication was not renally adjusted.  He was encouraged to push fluids.  Can use over-the-counter medication for discomfort.  Given his recurrent UTI and current episode of epididymitis I recommended that he follow-up with urology.  He was given contact information for local provider with instruction to call to schedule an appointment first thing next week.  We discussed that if he has any worsening or changing symptoms including worsening pain, fever, nausea, vomiting he needs to be seen immediately.  Strict return precautions given.  Final Clinical Impressions(s) / UC Diagnoses   Final diagnoses:  Epididymitis, left     Discharge Instructions      We are treating you for an infection in your testicle.  Please start levofloxacin daily for 10 days.  Given your symptoms I do recommend that you follow-up with urology.  Call them to schedule an appointment.  If you have any worsening or changing symptoms including increasing pain, difficulty urinating, fever, nausea, vomiting you need to be seen immediately.     ED Prescriptions     Medication Sig Dispense Auth. Provider   levofloxacin (LEVAQUIN) 500 MG tablet Take 1 tablet (500 mg total) by mouth daily. 10 tablet Nayellie Sanseverino, Noberto Retort, PA-C      PDMP not reviewed this encounter.   Jeani Hawking, PA-C 01/23/23 1802

## 2023-01-23 NOTE — Discharge Instructions (Signed)
We are treating you for an infection in your testicle.  Please start levofloxacin daily for 10 days.  Given your symptoms I do recommend that you follow-up with urology.  Call them to schedule an appointment.  If you have any worsening or changing symptoms including increasing pain, difficulty urinating, fever, nausea, vomiting you need to be seen immediately.

## 2023-01-23 NOTE — ED Triage Notes (Signed)
Pt reports he has a UTI the patient has Lt testicle pain and lt groin pain. Recent treatment for UTI.

## 2023-01-24 LAB — URINE CULTURE: Culture: NO GROWTH

## 2023-02-08 ENCOUNTER — Encounter (HOSPITAL_COMMUNITY): Payer: Self-pay

## 2023-02-08 ENCOUNTER — Ambulatory Visit (HOSPITAL_COMMUNITY)
Admission: EM | Admit: 2023-02-08 | Discharge: 2023-02-08 | Disposition: A | Discharge status: Home / Self Care | Payer: 59

## 2023-02-08 DIAGNOSIS — R590 Localized enlarged lymph nodes: Secondary | ICD-10-CM

## 2023-02-08 DIAGNOSIS — R6884 Jaw pain: Secondary | ICD-10-CM

## 2023-02-08 MED ORDER — NAPROXEN 500 MG PO TABS: 500.0000 mg | ORAL_TABLET | Freq: Two times a day (BID) | ORAL | 0 refills | Status: DC

## 2023-02-08 NOTE — ED Provider Notes (Signed)
[facility]    CSN: [id number] Arrival date & time: 02/08/23  1216      History   Chief Complaint Chief Complaint  Patient presents with   Lymphadenopathy    HPI Manuel Brady is a 67 y.o. male.   Pleasant 67 year old male presents today due to concerns of a painful swollen spot on the left side of his jaw just inferior of his ear.  States it is hard, about the size of a pea.  He woke up with it today.  He denies any injury or trauma to the area.  He does readmit to excessive weight lifting, but no injury.  He did apply a warm compress to the area and states it did help significantly.  He denies any ear pain or drainage.  He denies any weight loss, generalized lymphadenopathy, night sweats, or pruritus.  He is currently on Bactrim for a UTI. No other sx at present time. No increase in pain with opening of jaw. No trismus. No fever.      Past Medical History:  Diagnosis Date   Anxiety    Arthritis    R hip    Depression    Hepatitis C DX 2005   partial [facility] study   Neuropathy    PTSD (post-traumatic stress disorder)     Patient Active Problem List   Diagnosis Date Noted   Low back pain without sciatica 06/10/2021   Paresthesia 04/01/2021   Peripheral neuropathy 04/01/2021   PTSD (post-traumatic stress disorder) 05/05/2018   Pain, dental 08/06/2015   Cold sore 08/06/2015   Incidental lung nodule, greater than or equal to 8mm 06/12/2015   Bilateral renal cysts 06/12/2015   Liver fibrosis 05/14/2015   Allergic rhinitis 01/28/2015   Onychomycosis of toenail 01/28/2015   BPH (benign prostatic hypertrophy) 01/28/2015   Chronic hepatitis C without hepatic coma [facility])     Past Surgical History:  Procedure Laterality Date   arm surgery Left 2003   plates in arm, arm caught in machine, crush injury    arm surgery     COLONOSCOPY N/A 03/05/2015   Procedure: COLONOSCOPY;  Surgeon: West Bali, MD;  [facility];  Service: Endoscopy;  Laterality:  N/A;  11:15 AM   KNEE SURGERY Left 2005   NASAL SINUS SURGERY         Home Medications    Prior to Admission medications   Medication Sig Start Date End Date Taking? Authorizing Provider  fluticasone (FLONASE) 50 MCG/ACT nasal spray Place 1 spray into both nostrils daily. Begin by using 2 sprays in each nare daily for 3 to 5 days, then decrease to 1 spray in each nare daily. 12/31/21  Yes Theadora Rama Scales, PA-C  gabapentin (NEURONTIN) 300 MG capsule  12/05/22  Yes [provider]  ipratropium (ATROVENT) 0.06 % nasal spray Place 2 sprays into both nostrils 3 (three) times daily. As needed for nasal congestion, runny nose 12/31/21  Yes Theadora Rama Scales, PA-C  lisinopril-hydrochlorothiazide (ZESTORETIC) 10-12.5 MG tablet Take 1 tablet by mouth daily. 04/20/19  Yes Storm Frisk, MD  naproxen (NAPROSYN) 500 MG tablet Take 1 tablet (500 mg total) by mouth 2 (two) times daily with a meal. 02/08/23  Yes Kalijah Westfall L, PA  sulfamethoxazole-trimethoprim (BACTRIM DS) 800-160 MG tablet Take 1 tablet by mouth 2 (two) times daily.   Yes [provider]    Family History Family History  Problem Relation Age of Onset   Hypertension Mother  Cancer Father        lung   Colon cancer Neg Hx     Social History Social History   Tobacco Use   Smoking status: Former    Types: Cigarettes    Quit date: 01/08/2013    Years since quitting: 10.0   Smokeless tobacco: Never  Vaping Use   Vaping Use: Never used  Substance Use Topics   Alcohol use: No    Alcohol/week: 0.0 standard drinks of alcohol    Comment: former   Drug use: No    Types: Cocaine    Comment: Last use two years ago (2014)     Allergies   Patient has no known allergies.   Review of Systems Review of Systems As per HPI  Physical Exam Triage Vital Signs ED Triage Vitals  Enc Vitals Group     BP 02/08/23 1418 132/78     Pulse Rate 02/08/23 1418 65     Resp 02/08/23 1418 16     Temp  02/08/23 1418 97.7 F (36.5 C)     Temp Source 02/08/23 1418 Oral     SpO2 02/08/23 1418 97 %     Weight 02/08/23 1418 212 lb (96.2 kg)     Height 02/08/23 1418 6\' 1"  (1.854 m)     Head Circumference --      Peak Flow --      Pain Score 02/08/23 1417 6     Pain Loc --      Pain Edu? --      Excl. in GC? --    No data found.  Updated Vital Signs BP 132/78 (BP Location: Left Arm)   Pulse 65   Temp 97.7 F (36.5 C) (Oral)   Resp 16   Ht 6\' 1"  (1.854 m)   Wt 212 lb (96.2 kg)   SpO2 97%   BMI 27.97 kg/m   Visual Acuity Right Eye Distance:   Left Eye Distance:   Bilateral Distance:    Right Eye Near:   Left Eye Near:    Bilateral Near:     Physical Exam Vitals and nursing note reviewed.  Constitutional:      General: He is not in acute distress.    Appearance: Normal appearance. He is not ill-appearing, toxic-appearing or diaphoretic.  HENT:     Head: Normocephalic and atraumatic. No masses.     Jaw: There is normal jaw occlusion. Tenderness (possible calcified lesion to the L jaw/ posterior ramus which seems to resolve with full opening of mouth) present. No trismus, swelling or pain on movement.     Salivary Glands: Right salivary gland is not diffusely enlarged or tender. Left salivary gland is not diffusely enlarged or tender.      Right Ear: Tympanic membrane, ear canal and external ear normal. No laceration, drainage or swelling. There is no impacted cerumen. No mastoid tenderness. No PE tube. No hemotympanum. Tympanic membrane is not injected, scarred, perforated, erythematous, retracted or bulging.     Left Ear: Tympanic membrane, ear canal and external ear normal. No laceration, drainage or swelling. There is no impacted cerumen. No mastoid tenderness. No PE tube. Tympanic membrane is not injected, scarred, perforated, erythematous, retracted or bulging.     Ears:     Comments: Possible mild L sided preauricular lymph node    Nose: Nose normal.     Mouth/Throat:      Lips: Pink.     Mouth: Mucous membranes are moist.  Pharynx: Oropharynx is clear. Uvula midline. No pharyngeal swelling, oropharyngeal exudate, posterior oropharyngeal erythema or uvula swelling.  Neck:     Thyroid: No thyroid mass, thyromegaly or thyroid tenderness.  Musculoskeletal:     Cervical back: Normal range of motion and neck supple. No rigidity or torticollis. No pain with movement. Normal range of motion.  Lymphadenopathy:     Cervical: No cervical adenopathy.     Right cervical: No superficial, deep or posterior cervical adenopathy.    Left cervical: No superficial, deep or posterior cervical adenopathy.  Neurological:     Mental Status: He is alert.      UC Treatments / Results  Labs (all labs ordered are listed, but only abnormal results are displayed) Labs Reviewed - No data to display  EKG   Radiology No results found.  Procedures Procedures (including critical care time)  Medications Ordered in UC Medications - No data to display  Initial Impression / Assessment and Plan / UC Course  I have reviewed the triage vital signs and the nursing notes.  Pertinent labs & imaging results that were available during my care of the patient were reviewed by me and considered in my medical decision making (see chart for details).     Lymphadenopathy, preauricular - vs calcified nodule to L ramus of jaw. It is about the size of a pea and disappears upon full extension of jaw. I see no sign of head, neck, ear, or scalp infection. Warm compress did help. Discussed s/sx which would warrant further workup with PCP Pain in L lower jaw - as annotated above. Will do short trial of naproxen to see if this is a complication from TMJ given his excellent response to warm compress.   Final Clinical Impressions(s) / UC Diagnoses   Final diagnoses:  Lymphadenopathy, preauricular  Pain in lower jaw     Discharge Instructions      Please continue the warm compresses  to the affected area of your left ear/jaw. Please take the naproxen twice daily with a meal.  Avoid any additional over-the-counter anti-inflammatory medications.  If this lesion becomes more painful, grows in size, new ones develop, you develop a fever, or drenching night sweats occur, please follow-up with your primary care physician.     ED Prescriptions     Medication Sig Dispense Auth. Provider   naproxen (NAPROSYN) 500 MG tablet Take 1 tablet (500 mg total) by mouth 2 (two) times daily with a meal. 14 tablet Ilija Maxim L, PA      PDMP not reviewed this encounter.   Maretta Bees, [place] 02/08/23 1510

## 2023-02-08 NOTE — Discharge Instructions (Addendum)
Please continue the warm compresses to the affected area of your left ear/jaw. Please take the naproxen twice daily with a meal.  Avoid any additional over-the-counter anti-inflammatory medications.  If this lesion becomes more painful, grows in size, new ones develop, you develop a fever, or drenching night sweats occur, please follow-up with your primary care physician.

## 2023-02-08 NOTE — ED Triage Notes (Signed)
Patient here today with c/o left side swelling of his gland that he noticed today. Patient states that it is sore and tender to the touch. He took IBU and applied a warm compress which seem to help some.

## 2023-03-09 ENCOUNTER — Telehealth: Payer: Self-pay | Admitting: Urology

## 2023-03-09 NOTE — Telephone Encounter (Signed)
DOS - 04/06/23  Quintella Reichert OSTEOTOMY RIGHT --- [id number] RIGHT --- [id number]   [employer] EFFECTIVE DATE 08/10/22  PER [employer] WEBSITE FOR CPT CODE 60454 AND [phone] Notification or Prior Authorization is not required for the requested services You are not required to submit a notification/prior authorization based on the information provided. If you have general questions about the prior authorization requirements, visit [email or web address] > Clinician Resources > Advance and Admission Notification Requirements. The number above acknowledges your notification. Please write this reference number down for future reference. If you would like to request an organization determination, please call us at [phone].   Decision ID #: [id number]

## 2023-03-28 ENCOUNTER — Ambulatory Visit (HOSPITAL_COMMUNITY)
Admission: EM | Admit: 2023-03-28 | Discharge: 2023-03-28 | Disposition: A | Discharge status: Home / Self Care | Payer: 59 | Attending: Family Medicine | Admitting: Family Medicine

## 2023-03-28 ENCOUNTER — Encounter (HOSPITAL_COMMUNITY): Payer: Self-pay

## 2023-03-28 DIAGNOSIS — H9203 Otalgia, bilateral: Secondary | ICD-10-CM | POA: Diagnosis not present

## 2023-03-28 DIAGNOSIS — I889 Nonspecific lymphadenitis, unspecified: Secondary | ICD-10-CM

## 2023-03-28 MED ORDER — CEPHALEXIN 500 MG PO CAPS: 500.0000 mg | ORAL_CAPSULE | Freq: Two times a day (BID) | ORAL | 0 refills | Status: AC

## 2023-03-28 NOTE — ED Triage Notes (Signed)
Pt c/o lt earache and a swollen lymph node under lt ear since yesterday. States starting to have a runny nose and watery eyes today. States got a flu shot last Sunday.

## 2023-03-28 NOTE — Discharge Instructions (Signed)
It was nice seeing you. I am sorry about your painful swollen lymph node. Your ear exam looks benign. You could have common viral infection. May resume antibiotic if symptoms persists despite conservative measures. Use Ibuprofen as needed for pain Follow up with PCP soon

## 2023-03-28 NOTE — ED Provider Notes (Signed)
[facility]    CSN: [id number] Arrival date & time: 03/28/23  1153      History   Chief Complaint Chief Complaint  Patient presents with   Otalgia    HPI Manuel Brady is a 67 y.o. male.   The history is provided by the patient. No language interpreter was used.  Otalgia Location:  Bilateral Quality:  Throbbing Severity:  Moderate (6/10 in severity) Onset quality:  Gradual Timing:  Constant Progression:  Unchanged Chronicity:  Recurrent (He had similar presentation two months ago but was already on A/B. Pain resolved then) Relieved by:  Nothing Worsened by:  Nothing Associated symptoms: congestion   Associated symptoms: no ear discharge, no fever and no vomiting   Associated symptoms comment:  Coughing a bit 3 days ago, like a cold cough. Endorsed lymph node swelling of his left neck   Past Medical History:  Diagnosis Date   Anxiety    Arthritis    R hip    Depression    Hepatitis C DX 2005   partial [employer] study   Neuropathy    PTSD (post-traumatic stress disorder)     Patient Active Problem List   Diagnosis Date Noted   Low back pain without sciatica 06/10/2021   Paresthesia 04/01/2021   Peripheral neuropathy 04/01/2021   PTSD (post-traumatic stress disorder) 05/05/2018   Pain, dental 08/06/2015   Cold sore 08/06/2015   Incidental lung nodule, greater than or equal to 8mm 06/12/2015   Bilateral renal cysts 06/12/2015   Liver fibrosis 05/14/2015   Allergic rhinitis 01/28/2015   Onychomycosis of toenail 01/28/2015   BPH (benign prostatic hypertrophy) 01/28/2015   Chronic hepatitis C without hepatic coma [facility])     Past Surgical History:  Procedure Laterality Date   arm surgery Left 2003   plates in arm, arm caught in machine, crush injury    arm surgery     COLONOSCOPY N/A 03/05/2015   Procedure: COLONOSCOPY;  Surgeon: West Bali, MD;  [facility];  Service: Endoscopy;  Laterality: N/A;  11:15 AM   KNEE SURGERY Left 2005    NASAL SINUS SURGERY         Home Medications    Prior to Admission medications   Medication Sig Start Date End Date Taking? Authorizing Provider  cephALEXin (KEFLEX) 500 MG capsule Take 1 capsule (500 mg total) by mouth 2 (two) times daily for 5 days. 03/28/23 04/02/23 Yes Doreene Eland, MD  fluticasone (FLONASE) 50 MCG/ACT nasal spray Place 1 spray into both nostrils daily. Begin by using 2 sprays in each nare daily for 3 to 5 days, then decrease to 1 spray in each nare daily. 12/31/21   Theadora Rama Scales, PA-C  gabapentin (NEURONTIN) 300 MG capsule  12/05/22   [provider]  ipratropium (ATROVENT) 0.06 % nasal spray Place 2 sprays into both nostrils 3 (three) times daily. As needed for nasal congestion, runny nose 12/31/21   Theadora Rama Scales, PA-C  lisinopril-hydrochlorothiazide (ZESTORETIC) 10-12.5 MG tablet Take 1 tablet by mouth daily. 04/20/19   Storm Frisk, MD  naproxen (NAPROSYN) 500 MG tablet Take 1 tablet (500 mg total) by mouth 2 (two) times daily with a meal. 02/08/23   Crain, Whitney L, PA  sulfamethoxazole-trimethoprim (BACTRIM DS) 800-160 MG tablet Take 1 tablet by mouth 2 (two) times daily.    [provider]    Family History Family History  Problem Relation Age of Onset   Hypertension Mother  Cancer Father        lung   Colon cancer Neg Hx     Social History Social History   Tobacco Use   Smoking status: Former    Current packs/day: 0.00    Types: Cigarettes    Quit date: 01/08/2013    Years since quitting: 10.2   Smokeless tobacco: Never  Vaping Use   Vaping status: Never Used  Substance Use Topics   Alcohol use: No    Alcohol/week: 0.0 standard drinks of alcohol    Comment: former   Drug use: No    Types: Cocaine    Comment: Last use two years ago (2014)     Allergies   Patient has no known allergies.   Review of Systems Review of Systems  Constitutional:  Negative for fever.  HENT:  Positive for  congestion and ear pain. Negative for ear discharge.   Gastrointestinal:  Negative for vomiting.     Physical Exam Triage Vital Signs ED Triage Vitals  Encounter Vitals Group     BP 03/28/23 1250 129/71     Systolic BP Percentile --      Diastolic BP Percentile --      Pulse Rate 03/28/23 1250 79     Resp 03/28/23 1250 18     Temp 03/28/23 1250 97.8 F (36.6 C)     Temp Source 03/28/23 1250 Oral     SpO2 03/28/23 1250 98 %     Weight --      Height --      Head Circumference --      Peak Flow --      Pain Score 03/28/23 1251 6     Pain Loc --      Pain Education --      Exclude from Growth Chart --    No data found.  Updated Vital Signs BP 129/71 (BP Location: Left Arm)   Pulse 79   Temp 97.8 F (36.6 C) (Oral)   Resp 18   SpO2 98%   Visual Acuity Right Eye Distance:   Left Eye Distance:   Bilateral Distance:    Right Eye Near:   Left Eye Near:    Bilateral Near:     Physical Exam Vitals and nursing note reviewed.  Constitutional:      General: He is not in acute distress. HENT:     Right Ear: Tympanic membrane and ear canal normal.     Left Ear: Tympanic membrane and ear canal normal.     Mouth/Throat:     Pharynx: No oropharyngeal exudate or posterior oropharyngeal erythema.  Eyes:     Extraocular Movements: Extraocular movements intact.  Neck:     Comments: Mildly tender, mildly swollen, solitary left anterior cervical lymph node.  Cardiovascular:     Rate and Rhythm: Normal rate and regular rhythm.     Heart sounds: Normal heart sounds. No murmur heard. Pulmonary:     Effort: Pulmonary effort is normal. No respiratory distress.     Breath sounds: Normal breath sounds. No wheezing.  Musculoskeletal:     Cervical back: Neck supple.  Neurological:     Mental Status: He is alert.      UC Treatments / Results  Labs (all labs ordered are listed, but only abnormal results are displayed) Labs Reviewed - No data to  display  EKG   Radiology No results found.  Procedures Procedures (including critical care time)  Medications Ordered in UC Medications - No  data to display  Initial Impression / Assessment and Plan / UC Course  I have reviewed the triage vital signs and the nursing notes.  Pertinent labs & imaging results that were available during my care of the patient were reviewed by me and considered in my medical decision making (see chart for details).  Clinical Course as of 03/28/23 1315  Sun Mar 28, 2023  1314 Otalgia Given associated cough, this is likely viral Ear exam benign No antibiotic is indicated Continue Ibuprofen prn pain F/U soon if no improvement [KE]  1314 Acute cervical lymphadenopathy May be viral Patient insisted that he needed A/B therapy Keflex prescribed to use if no improvement Use Ibuprofen prn pain Return soon if symptoms worsens He agreed with the plan. [KE]    Clinical Course User Index [KE] Doreene Eland, MD    Final Clinical Impressions(s) / UC Diagnoses   Final diagnoses:  Cervical lymphadenitis  Otalgia of both ears     Discharge Instructions      It was nice seeing you. I am sorry about your painful swollen lymph node. Your ear exam looks benign. You could have common viral infection. May resume antibiotic if symptoms persists despite conservative measures. Use Ibuprofen as needed for pain Follow up with PCP soon     ED Prescriptions     Medication Sig Dispense Auth. Provider   cephALEXin (KEFLEX) 500 MG capsule Take 1 capsule (500 mg total) by mouth 2 (two) times daily for 5 days. 10 capsule Doreene Eland, MD      PDMP not reviewed this encounter.   Doreene Eland, MD 03/28/23 1315

## 2023-04-05 MED ORDER — ONDANSETRON HCL 4 MG PO TABS: 4.0000 mg | ORAL_TABLET | Freq: Three times a day (TID) | ORAL | 0 refills | Status: DC | PRN

## 2023-04-05 MED ORDER — OXYCODONE-ACETAMINOPHEN 10-325 MG PO TABS: 1.0000 | ORAL_TABLET | ORAL | 0 refills | Status: DC | PRN

## 2023-04-05 NOTE — Addendum Note (Signed)
Addended by: Lenn Sink on: 04/05/2023 03:07 PM   Modules accepted: Orders

## 2023-04-06 DIAGNOSIS — M2011 Hallux valgus (acquired), right foot: Secondary | ICD-10-CM | POA: Diagnosis not present

## 2023-04-07 ENCOUNTER — Telehealth: Payer: Self-pay

## 2023-04-07 NOTE — Telephone Encounter (Signed)
He should be fine to walk on it with boot

## 2023-04-07 NOTE — Telephone Encounter (Signed)
Patient called and left a message - he is unable to use the crutches after surgery on 8/27 - he would like to try a knee scooter instead and is asking for Rx. Please advise

## 2023-04-14 ENCOUNTER — Ambulatory Visit (INDEPENDENT_AMBULATORY_CARE_PROVIDER_SITE_OTHER): Payer: 59

## 2023-04-14 ENCOUNTER — Ambulatory Visit (INDEPENDENT_AMBULATORY_CARE_PROVIDER_SITE_OTHER): Payer: 59 | Admitting: Podiatry

## 2023-04-14 ENCOUNTER — Encounter: Payer: Self-pay | Admitting: Podiatry

## 2023-04-14 DIAGNOSIS — M21611 Bunion of right foot: Secondary | ICD-10-CM | POA: Diagnosis not present

## 2023-04-14 DIAGNOSIS — M21619 Bunion of unspecified foot: Secondary | ICD-10-CM | POA: Diagnosis not present

## 2023-04-14 NOTE — Progress Notes (Signed)
Subjective:   Patient ID: Manuel Brady, male   DOB: 67 y.o.   MRN: [id number]   HPI Patient states doing very well with surgery very pleased minimal discomfort   ROS      Objective:  Physical Exam  Neurovascular status intact negative Denna Haggard' sign noted wound edges well coapted hallux in rectus position good range of motion     Assessment:  Doing well post distal osteotomy first metatarsal right     Plan:  H&P x-ray taken reviewed reapplied sterile dressing instructed on elevation compression dispensed surgical shoe continue complete immobilization reappoint 3 weeks earlier if needed  X-rays indicate excellent position first metatarsal osteotomy healing well fixation in place joint

## 2023-04-15 ENCOUNTER — Encounter: Payer: Self-pay | Admitting: Podiatry

## 2023-04-15 ENCOUNTER — Telehealth: Payer: Self-pay | Admitting: Podiatry

## 2023-04-15 NOTE — Telephone Encounter (Signed)
Patient called.  He had bunion surgery and was seen yesterday.  Note indicates a dressing was applied.  Patient states he had his white stockings on and went to a doctor appointment today.  When he went to take them off, a little brown drainage came off on his sock from the incision.  Denies blood or pus.  Wants to know if this is normal right now.  He says the foot is a bit swollen, but he is elevating and icing.  No mention of pain.    Thought I would check with you before returning his call.  Was he supposed to keep your dressing on, or could it have been removed today?

## 2023-04-15 NOTE — Telephone Encounter (Signed)
Should be no problem. I did subcuticular stitch and can drain slightly

## 2023-04-26 ENCOUNTER — Encounter: Payer: 59 | Admitting: Podiatry

## 2023-05-05 ENCOUNTER — Encounter: Payer: Self-pay | Admitting: Podiatry

## 2023-05-05 ENCOUNTER — Ambulatory Visit (INDEPENDENT_AMBULATORY_CARE_PROVIDER_SITE_OTHER): Payer: 59

## 2023-05-05 ENCOUNTER — Ambulatory Visit (INDEPENDENT_AMBULATORY_CARE_PROVIDER_SITE_OTHER): Payer: 59 | Admitting: Podiatry

## 2023-05-05 VITALS — BP 143/81 | HR 80

## 2023-05-05 DIAGNOSIS — Z9889 Other specified postprocedural states: Secondary | ICD-10-CM | POA: Diagnosis not present

## 2023-05-05 DIAGNOSIS — M21619 Bunion of unspecified foot: Secondary | ICD-10-CM

## 2023-05-05 NOTE — Progress Notes (Signed)
Subjective:   Patient ID: Manuel Brady, male   DOB: 67 y.o.   MRN: [id number]   HPI Patient states doing well still having mild swelling but overall very pleased   ROS      Objective:  Physical Exam  Neurovascular status intact negative Denna Haggard' sign noted wound edges well coapted hallux rectus position mild swelling consistent with this.  Postop     Assessment:  Doing well post osteotomy right first metatarsal     Plan:  H&P x-ray reviewed advised on gradual increase in activities return to shoe dispensed ankle compression stocking reappoint 6 weeks earlier if needed  X-rays indicate good healing osteotomy site alignment good bone healing well fixation in place

## 2023-05-24 ENCOUNTER — Ambulatory Visit: Payer: 59 | Admitting: Podiatry

## 2023-05-26 ENCOUNTER — Encounter: Payer: Self-pay | Admitting: Podiatry

## 2023-05-26 ENCOUNTER — Ambulatory Visit (INDEPENDENT_AMBULATORY_CARE_PROVIDER_SITE_OTHER): Payer: 59 | Admitting: Podiatry

## 2023-05-26 DIAGNOSIS — M7752 Other enthesopathy of left foot: Secondary | ICD-10-CM | POA: Diagnosis not present

## 2023-05-26 MED ORDER — TRIAMCINOLONE ACETONIDE 10 MG/ML IJ SUSP
10.0000 mg | Freq: Once | INTRAMUSCULAR | Status: AC
  Administered 2023-05-26: 10 mg via INTRA_ARTICULAR

## 2023-05-26 NOTE — Progress Notes (Signed)
Subjective:   Patient ID: Manuel Brady, male   DOB: 67 y.o.   MRN: [id number]   HPI Patient presents stating the right bunion is done very well the left is inflamed I want to get it fixed but I want to wait till after basketball season.  I would like something temporary now   ROS      Objective:  Physical Exam  Neurovascular status intact right foot healing well range of motion good no crepitus and inflammation around the first MPJ left fluid buildup around the joint surface     Assessment:  Doing well post bunion surgery right with inflammation around the first MPJ left and structural bunion which needs to be corrected but not till next year     Plan:  H&P reviewed both conditions for the right can return to normal activities for the left I did sterile prep injected the medial capsule 3 mg dexamethasone Kenalog 5 mg Xylocaine advised on wider shoes and reappoint to recheck

## 2023-06-17 ENCOUNTER — Ambulatory Visit (INDEPENDENT_AMBULATORY_CARE_PROVIDER_SITE_OTHER): Payer: 59

## 2023-06-17 ENCOUNTER — Encounter: Payer: Self-pay | Admitting: Podiatry

## 2023-06-17 ENCOUNTER — Ambulatory Visit (INDEPENDENT_AMBULATORY_CARE_PROVIDER_SITE_OTHER): Payer: 59 | Admitting: Podiatry

## 2023-06-17 VITALS — Ht 73.0 in | Wt 212.0 lb

## 2023-06-17 DIAGNOSIS — Z9889 Other specified postprocedural states: Secondary | ICD-10-CM

## 2023-06-17 DIAGNOSIS — M7751 Other enthesopathy of right foot: Secondary | ICD-10-CM

## 2023-06-17 NOTE — Progress Notes (Signed)
Subjective:   Patient ID: Manuel Brady, male   DOB: 67 y.o.   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

## 2023-07-21 ENCOUNTER — Telehealth: Payer: Self-pay | Admitting: Plastic Surgery

## 2023-07-21 NOTE — Telephone Encounter (Signed)
called [employer] 07-21-23 to r/s provider out of office. needs to be r/s

## 2023-07-23 ENCOUNTER — Institutional Professional Consult (permissible substitution): Payer: 59 | Admitting: Plastic Surgery

## 2023-07-30 ENCOUNTER — Encounter (HOSPITAL_COMMUNITY): Payer: Self-pay | Admitting: Emergency Medicine

## 2023-07-30 ENCOUNTER — Ambulatory Visit (HOSPITAL_COMMUNITY)
Admission: EM | Admit: 2023-07-30 | Discharge: 2023-07-30 | Disposition: A | Discharge status: Home / Self Care | Payer: 59 | Attending: Internal Medicine | Admitting: Internal Medicine

## 2023-07-30 DIAGNOSIS — N390 Urinary tract infection, site not specified: Secondary | ICD-10-CM | POA: Diagnosis present

## 2023-07-30 LAB — POCT URINALYSIS DIP (MANUAL ENTRY)
Bilirubin, UA: NEGATIVE
Glucose, UA: NEGATIVE mg/dL
Leukocytes, UA: NEGATIVE
Nitrite, UA: NEGATIVE
Protein Ur, POC: NEGATIVE mg/dL
Spec Grav, UA: 1.015 (ref 1.010–1.025)
Urobilinogen, UA: 0.2 U/dL
pH, UA: 5.5 (ref 5.0–8.0)

## 2023-07-30 MED ORDER — CEPHALEXIN 500 MG PO CAPS: 500.0000 mg | ORAL_CAPSULE | Freq: Four times a day (QID) | ORAL | 0 refills | Status: AC

## 2023-07-30 NOTE — ED Provider Notes (Signed)
[facility]    CSN: [id number] Arrival date & time: 07/30/23  1312      History   Chief Complaint Chief Complaint  Patient presents with   Urinary Frequency    HPI Manuel Brady is a 67 y.o. male.   Patient presents today for 2 to 3-day history of itching at the tip of his penis, urinary frequency and urgency.  He denies voiding small amounts, foul urinary odor, hematuria, abdominal pain, flank pain, fever, nausea/vomiting, abdominal pain, penile discharge.  No penile rashes, sores, or lesions or swelling in the groin.  Reports history of UTI and symptoms presented similarly in the past.  Has been drinking plenty of water, however is fasting today for "health reasons."    Past Medical History:  Diagnosis Date   Anxiety    Arthritis    R hip    Depression    Hepatitis C DX 2005   partial [employer] study   Neuropathy    PTSD (post-traumatic stress disorder)     Patient Active Problem List   Diagnosis Date Noted   Low back pain without sciatica 06/10/2021   Paresthesia 04/01/2021   Peripheral neuropathy 04/01/2021   PTSD (post-traumatic stress disorder) 05/05/2018   Pain, dental 08/06/2015   Cold sore 08/06/2015   Incidental lung nodule, greater than or equal to 8mm 06/12/2015   Bilateral renal cysts 06/12/2015   Liver fibrosis 05/14/2015   Allergic rhinitis 01/28/2015   Onychomycosis of toenail 01/28/2015   Benign prostatic hyperplasia 01/28/2015   Chronic hepatitis C without hepatic coma [facility])     Past Surgical History:  Procedure Laterality Date   arm surgery Left 2003   plates in arm, arm caught in machine, crush injury    arm surgery     COLONOSCOPY N/A 03/05/2015   Procedure: COLONOSCOPY;  Surgeon: West Bali, MD;  [facility];  Service: Endoscopy;  Laterality: N/A;  11:15 AM   KNEE SURGERY Left 2005   NASAL SINUS SURGERY         Home Medications    Prior to Admission medications   Medication Sig Start Date End Date  Taking? Authorizing Provider  cephALEXin (KEFLEX) 500 MG capsule Take 1 capsule (500 mg total) by mouth 4 (four) times daily for 7 days. 07/30/23 08/06/23 Yes Valentino Nose, NP  fluticasone (FLONASE) 50 MCG/ACT nasal spray Place 1 spray into both nostrils daily. Begin by using 2 sprays in each nare daily for 3 to 5 days, then decrease to 1 spray in each nare daily. 12/31/21   Theadora Rama Scales, PA-C  gabapentin (NEURONTIN) 300 MG capsule  12/05/22   [provider]  ipratropium (ATROVENT) 0.06 % nasal spray Place 2 sprays into both nostrils 3 (three) times daily. As needed for nasal congestion, runny nose 12/31/21   Theadora Rama Scales, PA-C  lisinopril-hydrochlorothiazide (ZESTORETIC) 10-12.5 MG tablet Take 1 tablet by mouth daily. 04/20/19   Storm Frisk, MD  naproxen (NAPROSYN) 500 MG tablet Take 1 tablet (500 mg total) by mouth 2 (two) times daily with a meal. 02/08/23   Crain, Jodelle Gross, PA    Family History Family History  Problem Relation Age of Onset   Hypertension Mother    Cancer Father        lung   Colon cancer Neg Hx     Social History Social History   Tobacco Use   Smoking status: Former    Current packs/day: 0.00    Types:  Cigarettes    Quit date: 01/08/2013    Years since quitting: 10.5   Smokeless tobacco: Never  Vaping Use   Vaping status: Never Used  Substance Use Topics   Alcohol use: No    Alcohol/week: 0.0 standard drinks of alcohol    Comment: former   Drug use: No    Types: Cocaine    Comment: Last use two years ago (2014)     Allergies   Patient has no known allergies.   Review of Systems Review of Systems Per HPI  Physical Exam Triage Vital Signs ED Triage Vitals [07/30/23 1523]  Encounter Vitals Group     BP (!) 156/88     Systolic BP Percentile      Diastolic BP Percentile      Pulse Rate 68     Resp 17     Temp 97.7 F (36.5 C)     Temp Source Oral     SpO2 97 %     Weight      Height      Head  Circumference      Peak Flow      Pain Score 0     Pain Loc      Pain Education      Exclude from Growth Chart    No data found.  Updated Vital Signs BP 128/77 (BP Location: Left Arm)   Pulse 68   Temp 97.7 F (36.5 C) (Oral)   Resp 17   SpO2 97%   Visual Acuity Right Eye Distance:   Left Eye Distance:   Bilateral Distance:    Right Eye Near:   Left Eye Near:    Bilateral Near:     Physical Exam Vitals and nursing note reviewed.  Constitutional:      General: He is not in acute distress.    Appearance: Normal appearance. He is not toxic-appearing.  HENT:     Mouth/Throat:     Mouth: Mucous membranes are moist.     Pharynx: Oropharynx is clear.  Cardiovascular:     Rate and Rhythm: Normal rate and regular rhythm.  Pulmonary:     Effort: Pulmonary effort is normal. No respiratory distress.     Breath sounds: Normal breath sounds. No wheezing, rhonchi or rales.  Abdominal:     General: Abdomen is flat. Bowel sounds are normal. There is no distension.     Palpations: Abdomen is soft.     Tenderness: There is no abdominal tenderness. There is no right CVA tenderness, left CVA tenderness or guarding.  Skin:    General: Skin is warm and dry.     Coloration: Skin is not jaundiced or pale.     Findings: No erythema.  Neurological:     Mental Status: He is alert and oriented to person, place, and time.  Psychiatric:        Behavior: Behavior is cooperative.      UC Treatments / Results  Labs (all labs ordered are listed, but only abnormal results are displayed) Labs Reviewed  POCT URINALYSIS DIP (MANUAL ENTRY) - Abnormal; Notable for the following components:      Result Value   Ketones, POC UA trace (5) (*)    Blood, UA trace-lysed (*)    All other components within normal limits  URINE CULTURE    EKG   Radiology No results found.  Procedures Procedures (including critical care time)  Medications Ordered in UC Medications - No data to  display  Initial Impression / Assessment and Plan / UC Course  I have reviewed the triage vital signs and the nursing notes.  Pertinent labs & imaging results that were available during my care of the patient were reviewed by me and considered in my medical decision making (see chart for details).   Patient is well-appearing, normotensive, afebrile, not tachycardic, not tachypneic, oxygenating well on room air.    1. Complicated UTI (urinary tract infection) Urinalysis clear with exception of trace lysed blood Urine culture pending Will treat with Keflex 4 times daily for 7 days Continue plenty of hydration Recommended close follow-up with PCP or urology if symptoms do not improve with treatment  The patient was given the opportunity to ask questions.  All questions answered to their satisfaction.  The patient is in agreement to this plan.   Final Clinical Impressions(s) / UC Diagnoses   Final diagnoses:  Complicated UTI (urinary tract infection)     Discharge Instructions      Take the Keflex as prescribed to treat for UTI.  Will contact you if we need to change the antibiotic based on the urine culture.  If symptoms do not improve with treatment, recommend follow-up with your PCP or urology.     ED Prescriptions     Medication Sig Dispense Auth. Provider   cephALEXin (KEFLEX) 500 MG capsule Take 1 capsule (500 mg total) by mouth 4 (four) times daily for 7 days. 28 capsule Valentino Nose, NP      PDMP not reviewed this encounter.   Valentino Nose, NP 07/30/23 [phone]

## 2023-07-30 NOTE — Discharge Instructions (Addendum)
Take the Keflex as prescribed to treat for UTI.  Will contact you if we need to change the antibiotic based on the urine culture.  If symptoms do not improve with treatment, recommend follow-up with your PCP or urology.

## 2023-07-30 NOTE — ED Triage Notes (Addendum)
Pt states he thinks he has a uti. He c/o itching and discomfort at penis.   He also c/o cough and congestion for 3 days

## 2023-07-31 LAB — URINE CULTURE: Culture: NO GROWTH

## 2023-08-09 ENCOUNTER — Encounter (HOSPITAL_COMMUNITY): Payer: Self-pay

## 2023-08-09 ENCOUNTER — Ambulatory Visit (HOSPITAL_COMMUNITY)
Admission: EM | Admit: 2023-08-09 | Discharge: 2023-08-09 | Disposition: A | Discharge status: Home / Self Care | Payer: 59 | Attending: Internal Medicine | Admitting: Internal Medicine

## 2023-08-09 DIAGNOSIS — L299 Pruritus, unspecified: Secondary | ICD-10-CM | POA: Insufficient documentation

## 2023-08-09 DIAGNOSIS — N368 Other specified disorders of urethra: Secondary | ICD-10-CM | POA: Diagnosis not present

## 2023-08-09 DIAGNOSIS — Z87440 Personal history of urinary (tract) infections: Secondary | ICD-10-CM | POA: Diagnosis not present

## 2023-08-09 DIAGNOSIS — H9201 Otalgia, right ear: Secondary | ICD-10-CM

## 2023-08-09 LAB — POCT URINALYSIS DIP (MANUAL ENTRY)
Bilirubin, UA: NEGATIVE
Glucose, UA: NEGATIVE mg/dL
Ketones, POC UA: NEGATIVE mg/dL
Leukocytes, UA: NEGATIVE
Nitrite, UA: NEGATIVE
Protein Ur, POC: NEGATIVE mg/dL
Spec Grav, UA: 1.01
Urobilinogen, UA: 0.2 U/dL
pH, UA: 6

## 2023-08-09 MED ORDER — NYSTATIN 100000 UNIT/GM EX CREA: TOPICAL_CREAM | CUTANEOUS | 0 refills | Status: AC

## 2023-08-09 NOTE — ED Triage Notes (Signed)
Patient here today with c/o penile itching after urinating X 2 weeks. Patient states that he was taking cephalexin for UTI but was told to stop taking it.   Patient would also like to have his right ear check to see it there is no infection. He has had a little pain in his right ear for a couple days.

## 2023-08-09 NOTE — Discharge Instructions (Signed)
We will call you when the urine culture comes back if positive and you need treatment. In the mean time you may try the yeast cream I have sent to your pharmacy.

## 2023-08-09 NOTE — ED Provider Notes (Signed)
[facility]    CSN: [id number] Arrival date & time: 08/09/23  1031      History   Chief Complaint Chief Complaint  Patient presents with   Penile Itching   Otalgia    HPI Manuel Brady is a 67 y.o. male who presents with recurrent meatal itching when not urinating x 2 weeks. Denies rashes, lesions, or penile discharge. Had negative STD in the past when he has similar symptoms. Was treated for UTI when seen last and palced on Keflex, but when his urine culture came back negative, was told to d/c. He was advised to see urology during last visit and he saw him and everything was fine.    2- Has had mild R ear pain x 2 days and would like to have it checked. He was using a Qtip on 12/25 and saw blood on it, but none since. Denies URI or fever.   Past Medical History:  Diagnosis Date   Anxiety    Arthritis    R hip    Depression    Hepatitis C DX 2005   partial [facility] study   Neuropathy    PTSD (post-traumatic stress disorder)     Patient Active Problem List   Diagnosis Date Noted   Low back pain without sciatica 06/10/2021   Paresthesia 04/01/2021   Peripheral neuropathy 04/01/2021   PTSD (post-traumatic stress disorder) 05/05/2018   Pain, dental 08/06/2015   Cold sore 08/06/2015   Incidental lung nodule, greater than or equal to 8mm 06/12/2015   Bilateral renal cysts 06/12/2015   Liver fibrosis 05/14/2015   Allergic rhinitis 01/28/2015   Onychomycosis of toenail 01/28/2015   Benign prostatic hyperplasia 01/28/2015   Chronic hepatitis C without hepatic coma [facility])     Past Surgical History:  Procedure Laterality Date   arm surgery Left 2003   plates in arm, arm caught in machine, crush injury    arm surgery     COLONOSCOPY N/A 03/05/2015   Procedure: COLONOSCOPY;  Surgeon: West Bali, MD;  [facility];  Service: Endoscopy;  Laterality: N/A;  11:15 AM   KNEE SURGERY Left 2005   NASAL SINUS SURGERY         Home Medications     Prior to Admission medications   Medication Sig Start Date End Date Taking? Authorizing Provider  LUMIGAN 0.01 % SOLN Place 1 drop into the left eye at bedtime. 07/20/23  Yes [provider]  nystatin cream (MYCOSTATIN) Apply to affected area 2 times daily 08/09/23  Yes Rodriguez-Southworth, Nettie Elm, PA-C  triamcinolone cream (KENALOG) 0.1 % Apply 1 Application topically. 07/13/23  Yes [provider]  fluticasone (FLONASE) 50 MCG/ACT nasal spray Place 1 spray into both nostrils daily. Begin by using 2 sprays in each nare daily for 3 to 5 days, then decrease to 1 spray in each nare daily. 12/31/21   Theadora Rama Scales, PA-C  gabapentin (NEURONTIN) 300 MG capsule  12/05/22   [provider]  lisinopril (ZESTRIL) 10 MG tablet Take 10 mg by mouth daily.    [provider]    Family History Family History  Problem Relation Age of Onset   Hypertension Mother    Cancer Father        lung   Colon cancer Neg Hx     Social History Social History   Tobacco Use   Smoking status: Former    Current packs/day: 0.00    Types: Cigarettes    Quit  date: 01/08/2013    Years since quitting: 10.5   Smokeless tobacco: Never  Vaping Use   Vaping status: Never Used  Substance Use Topics   Alcohol use: No    Alcohol/week: 0.0 standard drinks of alcohol    Comment: former   Drug use: No    Types: Cocaine    Comment: Last use two years ago (2014)     Allergies   Patient has no known allergies.   Review of Systems Review of Systems  As noted in HPI Physical Exam Triage Vital Signs ED Triage Vitals  Encounter Vitals Group     BP 08/09/23 1127 139/78     Systolic BP Percentile --      Diastolic BP Percentile --      Pulse Rate 08/09/23 1127 67     Resp 08/09/23 1127 16     Temp 08/09/23 1127 98.4 F (36.9 C)     Temp Source 08/09/23 1127 Oral     SpO2 08/09/23 1127 98 %     Weight 08/09/23 1127 210 lb (95.3 kg)     Height 08/09/23 1127 6\' 1"   (1.854 m)     Head Circumference --      Peak Flow --      Pain Score 08/09/23 1126 4     Pain Loc --      Pain Education --      Exclude from Growth Chart --    No data found.  Updated Vital Signs BP 139/78 (BP Location: Left Arm)   Pulse 67   Temp 98.4 F (36.9 C) (Oral)   Resp 16   Ht 6\' 1"  (1.854 m)   Wt 210 lb (95.3 kg)   SpO2 98%   BMI 27.71 kg/m   Visual Acuity Right Eye Distance:   Left Eye Distance:   Bilateral Distance:    Right Eye Near:   Left Eye Near:    Bilateral Near:     Physical Exam Vitals and nursing note reviewed.  Constitutional:      Appearance: He is normal weight.  HENT:     Right Ear: Ear canal normal.     Left Ear: Tympanic membrane, ear canal and external ear normal.     Ears:     Comments: R Tm is a little dull, but is gray  and no signs of perforation Eyes:     General: No scleral icterus.    Conjunctiva/sclera: Conjunctivae normal.  Pulmonary:     Effort: Pulmonary effort is normal.  Musculoskeletal:     Cervical back: Neck supple.  Neurological:     Mental Status: He is alert and oriented to person, place, and time.  Psychiatric:        Mood and Affect: Mood normal.        Behavior: Behavior normal.        Thought Content: Thought content normal.        Judgment: Judgment normal.      UC Treatments / Results  Labs (all labs ordered are listed, but only abnormal results are displayed) Labs Reviewed  POCT URINALYSIS DIP (MANUAL ENTRY) - Abnormal; Notable for the following components:      Result Value   Blood, UA trace-lysed (*)    All other components within normal limits  URINE CULTURE    EKG   Radiology No results found.  Procedures Procedures (including critical care time)  Medications Ordered in UC Medications - No data to display  Initial  Impression / Assessment and Plan / UC Course  I have reviewed the triage vital signs and the nursing notes.  Pertinent labs  results that were available during my  care of the patient were reviewed by me and considered in my medical decision making (see chart for details).  Meatal itching R otalgia  I sent the urine for a culture and if positive, we will sent medications and inform him. In the mean time I will have him try Nystatin cream when he feels the itching.    Final Clinical Impressions(s) / UC Diagnoses   Final diagnoses:  Irritation of urethral meatus  History of UTI  Right ear pain     Discharge Instructions      We will call you when the urine culture comes back if positive and you need treatment. In the mean time you may try the yeast cream I have sent to your pharmacy.      ED Prescriptions     Medication Sig Dispense Auth. Provider   nystatin cream (MYCOSTATIN) Apply to affected area 2 times daily 30 g Rodriguez-Southworth, Nettie Elm, PA-C      PDMP not reviewed this encounter.   Garey Ham, PA-C 08/09/23 1253

## 2023-08-10 LAB — URINE CULTURE
Culture: NO GROWTH
Special Requests: NORMAL

## 2023-08-14 ENCOUNTER — Encounter (HOSPITAL_COMMUNITY): Payer: Self-pay

## 2023-08-14 ENCOUNTER — Ambulatory Visit (HOSPITAL_COMMUNITY)
Admission: EM | Admit: 2023-08-14 | Discharge: 2023-08-14 | Disposition: A | Discharge status: Home / Self Care | Payer: 59 | Attending: Internal Medicine | Admitting: Internal Medicine

## 2023-08-14 DIAGNOSIS — N451 Epididymitis: Secondary | ICD-10-CM

## 2023-08-14 LAB — POCT URINALYSIS DIP (MANUAL ENTRY)
Bilirubin, UA: NEGATIVE
Glucose, UA: NEGATIVE mg/dL
Ketones, POC UA: NEGATIVE mg/dL
Leukocytes, UA: NEGATIVE
Nitrite, UA: NEGATIVE
Protein Ur, POC: NEGATIVE mg/dL
Spec Grav, UA: 1.02 (ref 1.010–1.025)
Urobilinogen, UA: 0.2 U/dL
pH, UA: 6.5 (ref 5.0–8.0)

## 2023-08-14 MED ORDER — CEFTRIAXONE SODIUM 500 MG IJ SOLR
INTRAMUSCULAR | Status: AC
  Filled 2023-08-14: qty 500

## 2023-08-14 MED ORDER — PHENAZOPYRIDINE HCL 95 MG PO TABS: 95.0000 mg | ORAL_TABLET | Freq: Three times a day (TID) | ORAL | 0 refills | Status: AC | PRN

## 2023-08-14 MED ORDER — CEFTRIAXONE SODIUM 1 G IJ SOLR
0.5000 g | Freq: Once | INTRAMUSCULAR | Status: AC
  Administered 2023-08-14: 0.5 g via INTRAMUSCULAR

## 2023-08-14 MED ORDER — DOXYCYCLINE HYCLATE 100 MG PO CAPS: 100.0000 mg | ORAL_CAPSULE | Freq: Two times a day (BID) | ORAL | 0 refills | Status: AC

## 2023-08-14 MED ORDER — LIDOCAINE HCL (PF) 1 % IJ SOLN
INTRAMUSCULAR | Status: AC
  Filled 2023-08-14: qty 2

## 2023-08-14 NOTE — ED Triage Notes (Signed)
 Pain after urination x 7 days. States was seen for this before but was not given abx. No pain in the back or abdomin. No discoloration to the urine that he has noticed. Burning sensation at the tip of the penis after urination.

## 2023-08-14 NOTE — ED Provider Notes (Signed)
 Seen by a different provider   Rodriguez-Southworth, [place], PA-C 11/12/23 [phone]

## 2023-08-14 NOTE — Discharge Instructions (Addendum)
 We have treated you for epididymitis today in clinic with an injection of antibiotics.  I am sending you home on antibiotics, take this twice daily with food.  You can use the Pyridium  up to 3 times daily as needed for the burning sensation, this will change the color of your urine and secretions.  Ensure you are staying well-hydrated with at least 64 ounces of water daily.    Please follow-up with urology for further investigation if this reoccurs.

## 2023-08-14 NOTE — ED Provider Notes (Signed)
 [facility]    CSN: [id number] Arrival date & time: 08/14/23  1006      History   Chief Complaint Chief Complaint  Patient presents with   Dysuria    HPI Manuel Brady is a 68 y.o. male.   Patient presents to clinic for bladder discomfort and pain/burning sensation that occurs at the end of urination for the past 7 days.  Patient is also been having scrotal pain and discomfort for the past 7 days as well, mostly the left side.  No swelling.  This did start shortly after he was sexually active with his fiance, did not use a condom.  Patient adamant that he does not have any sexually transmitted infections.  No penile discharge.  No sores or lesions.  He was given a cream for a rash at the tip of his penis previously and the rash has since resolved.  No fevers.  No abdominal pain.  No nausea, vomiting or flank pain.   Dysuria   Past Medical History:  Diagnosis Date   Anxiety    Arthritis    R hip    Depression    Hepatitis C DX 2005   partial [facility] study   Neuropathy    PTSD (post-traumatic stress disorder)     Patient Active Problem List   Diagnosis Date Noted   Low back pain without sciatica 06/10/2021   Paresthesia 04/01/2021   Peripheral neuropathy 04/01/2021   PTSD (post-traumatic stress disorder) 05/05/2018   Pain, dental 08/06/2015   Cold sore 08/06/2015   Incidental lung nodule, greater than or equal to 8mm 06/12/2015   Bilateral renal cysts 06/12/2015   Liver fibrosis 05/14/2015   Allergic rhinitis 01/28/2015   Onychomycosis of toenail 01/28/2015   Benign prostatic hyperplasia 01/28/2015   Chronic hepatitis C without hepatic coma [facility])     Past Surgical History:  Procedure Laterality Date   arm surgery Left 2003   plates in arm, arm caught in machine, crush injury    arm surgery     COLONOSCOPY N/A 03/05/2015   Procedure: COLONOSCOPY;  Surgeon: Margo LITTIE Haddock, MD;  [facility];  Service: Endoscopy;  Laterality: N/A;  11:15  AM   KNEE SURGERY Left 2005   NASAL SINUS SURGERY         Home Medications    Prior to Admission medications   Medication Sig Start Date End Date Taking? Authorizing Provider  doxycycline  (VIBRAMYCIN ) 100 MG capsule Take 1 capsule (100 mg total) by mouth 2 (two) times daily. 08/14/23  Yes Effrey Davidow  N, FNP  fluticasone  (FLONASE ) 50 MCG/ACT nasal spray Place 1 spray into both nostrils daily. Begin by using 2 sprays in each nare daily for 3 to 5 days, then decrease to 1 spray in each nare daily. 12/31/21  Yes Joesph Shaver Scales, PA-C  gabapentin (NEURONTIN) 300 MG capsule  12/05/22  Yes [provider]  lisinopril  (ZESTRIL ) 10 MG tablet Take 10 mg by mouth daily.   Yes [provider]  LUMIGAN 0.01 % SOLN Place 1 drop into the left eye at bedtime. 07/20/23  Yes [provider]  nystatin  cream (MYCOSTATIN ) Apply to affected area 2 times daily 08/09/23  Yes Rodriguez-Southworth, Sylvia, PA-C  phenazopyridine  (PYRIDIUM ) 95 MG tablet Take 1 tablet (95 mg total) by mouth 3 (three) times daily as needed for pain. 08/14/23  Yes Davia Smyre  N, FNP  triamcinolone  cream (KENALOG ) 0.1 % Apply 1 Application topically. 07/13/23  Yes [provider]  Family History Family History  Problem Relation Age of Onset   Hypertension Mother    Cancer Father        lung   Colon cancer Neg Hx     Social History Social History   Tobacco Use   Smoking status: Former    Current packs/day: 0.00    Types: Cigarettes    Quit date: 01/08/2013    Years since quitting: 10.6   Smokeless tobacco: Never  Vaping Use   Vaping status: Never Used  Substance Use Topics   Alcohol use: No    Alcohol/week: 0.0 standard drinks of alcohol    Comment: former   Drug use: No    Types: Cocaine    Comment: Last use two years ago (2014)     Allergies   Patient has no known allergies.   Review of Systems Review of Systems  Per HPI   Physical Exam Triage Vital  Signs ED Triage Vitals  Encounter Vitals Group     BP 08/14/23 1053 135/69     Systolic BP Percentile --      Diastolic BP Percentile --      Pulse Rate 08/14/23 1053 71     Resp 08/14/23 1053 16     Temp 08/14/23 1053 97.6 F (36.4 C)     Temp Source 08/14/23 1053 Oral     SpO2 08/14/23 1053 97 %     Weight 08/14/23 1053 215 lb (97.5 kg)     Height 08/14/23 1053 6' 1 (1.854 m)     Head Circumference --      Peak Flow --      Pain Score 08/14/23 1052 7     Pain Loc --      Pain Education --      Exclude from Growth Chart --    No data found.  Updated Vital Signs BP 135/69 (BP Location: Left Arm)   Pulse 71   Temp 97.6 F (36.4 C) (Oral)   Resp 16   Ht 6' 1 (1.854 m)   Wt 215 lb (97.5 kg)   SpO2 97%   BMI 28.37 kg/m   Visual Acuity Right Eye Distance:   Left Eye Distance:   Bilateral Distance:    Right Eye Near:   Left Eye Near:    Bilateral Near:     Physical Exam Vitals and nursing note reviewed.  Constitutional:      Appearance: Normal appearance.  HENT:     Head: Normocephalic and atraumatic.     Right Ear: External ear normal.     Left Ear: External ear normal.     Nose: Nose normal.     Mouth/Throat:     Mouth: Mucous membranes are moist.  Eyes:     Conjunctiva/sclera: Conjunctivae normal.  Cardiovascular:     Rate and Rhythm: Normal rate.  Pulmonary:     Effort: Pulmonary effort is normal. No respiratory distress.  Musculoskeletal:        General: Normal range of motion.  Neurological:     General: No focal deficit present.     Mental Status: He is alert.  Psychiatric:        Mood and Affect: Mood normal.      UC Treatments / Results  Labs (all labs ordered are listed, but only abnormal results are displayed) Labs Reviewed  POCT URINALYSIS DIP (MANUAL ENTRY) - Abnormal; Notable for the following components:      Result Value   Blood, UA  trace-intact (*)    All other components within normal limits    EKG   Radiology No  results found.  Procedures Procedures (including critical care time)  Medications Ordered in UC Medications  cefTRIAXone  (ROCEPHIN ) injection 0.5 g (has no administration in time range)    Initial Impression / Assessment and Plan / UC Course  I have reviewed the triage vital signs and the nursing notes.  Pertinent labs & imaging results that were available during my care of the patient were reviewed by me and considered in my medical decision making (see chart for details).  Vitals and triage reviewed, patient is hemodynamically stable.  Urinalysis shows blood.  Combined with recent sexual activity and scrotal discomfort will treat for epididymitis with IM Rocephin  and doxycycline .  Encouraged urology follow-up as scheduled.  Patient adamantly declining STI testing.  Does not appear to be acutely ill, no fevers, without tachycardia, flank pain or abdominal pain.  Plan of care, follow-up care return precautions given, no questions at this time.     Final Clinical Impressions(s) / UC Diagnoses   Final diagnoses:  Epididymitis     Discharge Instructions      We have treated you for epididymitis today in clinic with an injection of antibiotics.  I am sending you home on antibiotics, take this twice daily with food.  You can use the Pyridium  up to 3 times daily as needed for the burning sensation, this will change the color of your urine and secretions.  Ensure you are staying well-hydrated with at least 64 ounces of water daily.    Please follow-up with urology for further investigation if this reoccurs.    ED Prescriptions     Medication Sig Dispense Auth. Provider   doxycycline  (VIBRAMYCIN ) 100 MG capsule Take 1 capsule (100 mg total) by mouth 2 (two) times daily. 20 capsule Dreama, Rana Adorno  N, FNP   phenazopyridine  (PYRIDIUM ) 95 MG tablet Take 1 tablet (95 mg total) by mouth 3 (three) times daily as needed for pain. 10 tablet Dreama, Ruqayyah Lute  N, FNP      PDMP not reviewed  this encounter.   Dreama Rashunda Passon  N, FNP 08/14/23 1128

## 2023-08-15 ENCOUNTER — Telehealth (HOSPITAL_COMMUNITY): Payer: Self-pay | Admitting: Emergency Medicine

## 2023-08-15 MED ORDER — PHENAZOPYRIDINE HCL 100 MG PO TABS: 100.0000 mg | ORAL_TABLET | Freq: Three times a day (TID) | ORAL | 0 refills | Status: AC | PRN

## 2023-08-15 NOTE — Telephone Encounter (Signed)
 95 mg dose of Pyridium not available at pharmacy, pharmacy requested change.  100 mg dose sent in.

## 2023-08-31 ENCOUNTER — Institutional Professional Consult (permissible substitution): Payer: 59 | Admitting: Plastic Surgery

## 2024-03-15 ENCOUNTER — Institutional Professional Consult (permissible substitution): Payer: Self-pay

## 2024-05-26 ENCOUNTER — Ambulatory Visit (INDEPENDENT_AMBULATORY_CARE_PROVIDER_SITE_OTHER)

## 2024-05-26 ENCOUNTER — Encounter: Payer: Self-pay | Admitting: Podiatry

## 2024-05-26 ENCOUNTER — Ambulatory Visit (INDEPENDENT_AMBULATORY_CARE_PROVIDER_SITE_OTHER): Admitting: Podiatry

## 2024-05-26 DIAGNOSIS — M21612 Bunion of left foot: Secondary | ICD-10-CM

## 2024-05-26 DIAGNOSIS — M21619 Bunion of unspecified foot: Secondary | ICD-10-CM

## 2024-05-26 DIAGNOSIS — M21611 Bunion of right foot: Secondary | ICD-10-CM | POA: Diagnosis not present

## 2024-05-26 DIAGNOSIS — L600 Ingrowing nail: Secondary | ICD-10-CM

## 2024-05-26 NOTE — Patient Instructions (Signed)

## 2024-05-26 NOTE — Progress Notes (Signed)
 Subjective:   Patient ID: Manuel Brady, male   DOB: 68 y.o.   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  For the right ingrown toenail correction recommended and patient read then signed consent form for correction and I infiltrated the right big toe 60 mg like Marcaine mixture sterile prep done using sterile instrumentation removed the border exposed matrix applied phenol 3 applications 30 seconds followed by alcohol of our sterile dressing gave instructions on soaks wear dressing 24 hours take it off earlier if throbbing were to occur and answered all questions.  For the left bunion correction recommended he wants to get this done and I explained procedure risk and patient wants surgery.  Patient scheduled outpatient surgery all questions answered today and he understands total recovery.  Will take several months and that he will be in a boot shoe for about 4 to 6 weeks.  X-rays indicate significant bunion deformity left elevation of the intermetatarsal angle of approximately 15 degrees

## 2024-05-29 ENCOUNTER — Telehealth: Payer: Self-pay | Admitting: Podiatry

## 2024-05-29 NOTE — Telephone Encounter (Signed)
 Called and left message for patient to contact office back in regards to surgery date.

## 2024-07-24 ENCOUNTER — Encounter (HOSPITAL_COMMUNITY): Payer: Self-pay | Admitting: Emergency Medicine

## 2024-07-24 ENCOUNTER — Ambulatory Visit (HOSPITAL_COMMUNITY): Admission: EM | Admit: 2024-07-24 | Discharge: 2024-07-24 | Disposition: A | Discharge status: Home / Self Care

## 2024-07-24 DIAGNOSIS — J069 Acute upper respiratory infection, unspecified: Secondary | ICD-10-CM | POA: Diagnosis not present

## 2024-07-24 LAB — POC COVID19/FLU A&B COMBO
Covid Antigen, POC: NEGATIVE
Influenza A Antigen, POC: NEGATIVE
Influenza B Antigen, POC: NEGATIVE

## 2024-07-24 MED ORDER — AMOXICILLIN-POT CLAVULANATE 875-125 MG PO TABS: 1.0000 | ORAL_TABLET | Freq: Two times a day (BID) | ORAL | 0 refills | Status: AC

## 2024-07-24 MED ORDER — AZELASTINE HCL 0.1 % NA SOLN: 1.0000 | Freq: Two times a day (BID) | NASAL | 1 refills | Status: AC

## 2024-07-24 MED ORDER — PREDNISONE 20 MG PO TABS: 40.0000 mg | ORAL_TABLET | Freq: Every day | ORAL | 0 refills | Status: AC

## 2024-07-24 NOTE — ED Provider Notes (Signed)
 [facility]  Note:  This document was prepared using Conservation officer, historic buildings and may include unintentional dictation errors.  MRN: [id number] DOB: 1955/09/21  Subjective:   Manuel Brady is a 68 y.o. male presenting for cough, chest congestion, facial pressure, sinus congestion, cough productive of sputum.  Patient reports that he normally gets sinusitis and fall/winter reports he is usually given an antibiotic and symptoms improved.  Patient reports he took Mucinex and drink plenty of water with minimal improvement to symptoms.  No fever, body aches, fatigue, chest pain, shortness of breath, weakness, dizziness.  Patient denies any known sick contacts.  Current Medications[1]   Allergies[2]  Past Medical History:  Diagnosis Date   Anxiety    Arthritis    R hip    Depression    Hepatitis C DX 2005   partial [facility] study   Neuropathy    PTSD (post-traumatic stress disorder)      Past Surgical History:  Procedure Laterality Date   arm surgery Left 2003   plates in arm, arm caught in machine, crush injury    arm surgery     COLONOSCOPY N/A 03/05/2015   Procedure: COLONOSCOPY;  Surgeon: Margo LITTIE Haddock, MD;  [facility];  Service: Endoscopy;  Laterality: N/A;  11:15 AM   KNEE SURGERY Left 2005   NASAL SINUS SURGERY      Family History  Problem Relation Age of Onset   Hypertension Mother    Cancer Father        lung   Colon cancer Neg Hx     Social History[3]  ROS Refer to HPI for ROS details.  Objective:    Vitals: BP (!) 159/91 (BP Location: Left Arm)   Pulse 68   Temp 98.4 F (36.9 C) (Oral)   Resp 18   SpO2 98%   Physical Exam Vitals and nursing note reviewed.  Constitutional:      General: He is not in acute distress.    Appearance: Normal appearance. He is well-developed. He is not ill-appearing, toxic-appearing or diaphoretic.  HENT:     Head: Normocephalic.     Nose: Congestion present. No rhinorrhea.      Mouth/Throat:     Mouth: Mucous membranes are moist.     Pharynx: Oropharynx is clear. No posterior oropharyngeal erythema.  Cardiovascular:     Rate and Rhythm: Normal rate and regular rhythm.  Pulmonary:     Effort: Pulmonary effort is normal. No respiratory distress.     Breath sounds: Normal breath sounds. No stridor. No wheezing.  Chest:     Chest wall: No tenderness.  Skin:    General: Skin is warm and dry.  Neurological:     General: No focal deficit present.     Mental Status: He is alert and oriented to person, place, and time.  Psychiatric:        Mood and Affect: Mood normal.        Behavior: Behavior normal.     Procedures  Results for orders placed or performed during the hospital encounter of 07/24/24 (from the past 24 hours)  POC Covid19/Flu A&B Antigen     Status: None   Collection Time: 07/24/24  9:31 AM  Result Value Ref Range   Influenza A Antigen, POC Negative Negative   Influenza B Antigen, POC Negative Negative   Covid Antigen, POC Negative Negative    Assessment and Plan :     Discharge Instructions  1. Acute upper respiratory infection (Primary) - POC Covid19/Flu A&B Antigen complete in UC is negative for COVID and influenza. - azelastine  (ASTELIN ) 0.1 % nasal spray; Place 1 spray into both nostrils 2 (two) times daily. Use in each nostril as directed  Dispense: 30 mL; Refill: 1 - predniSONE  (DELTASONE ) 20 MG tablet; Take 2 tablets (40 mg total) by mouth daily for 5 days.  Dispense: 10 tablet; Refill: 0 - amoxicillin -clavulanate (AUGMENTIN ) 875-125 MG tablet; Take 1 tablet by mouth every 12 (twelve) hours for 7 days.  Dispense: 14 tablet; Refill: 0  -Continue to monitor symptoms for any change in severity if there is any escalation of current symptoms or development of new symptoms follow-up in ER for further evaluation and management.      Renee Beale B Amina Menchaca    [1] No current facility-administered medications for this  encounter.  Current Outpatient Medications:    [START ON 07/28/2024] amoxicillin -clavulanate (AUGMENTIN ) 875-125 MG tablet, Take 1 tablet by mouth every 12 (twelve) hours for 7 days., Disp: 14 tablet, Rfl: 0   azelastine  (ASTELIN ) 0.1 % nasal spray, Place 1 spray into both nostrils 2 (two) times daily. Use in each nostril as directed, Disp: 30 mL, Rfl: 1   predniSONE  (DELTASONE ) 20 MG tablet, Take 2 tablets (40 mg total) by mouth daily for 5 days., Disp: 10 tablet, Rfl: 0   doxycycline  (VIBRAMYCIN ) 100 MG capsule, Take 1 capsule (100 mg total) by mouth 2 (two) times daily. (Patient not taking: Reported on 05/26/2024), Disp: 20 capsule, Rfl: 0   fluticasone  (FLONASE ) 50 MCG/ACT nasal spray, Place 1 spray into both nostrils daily. Begin by using 2 sprays in each nare daily for 3 to 5 days, then decrease to 1 spray in each nare daily., Disp: 32 mL, Rfl: 1   gabapentin (NEURONTIN) 300 MG capsule, , Disp: , Rfl:    lisinopril  (ZESTRIL ) 10 MG tablet, Take 10 mg by mouth daily., Disp: , Rfl:    LUMIGAN 0.01 % SOLN, Place 1 drop into the left eye at bedtime., Disp: , Rfl:    nystatin  cream (MYCOSTATIN ), Apply to affected area 2 times daily, Disp: 30 g, Rfl: 0   phenazopyridine  (PYRIDIUM ) 100 MG tablet, Take 1 tablet (100 mg total) by mouth 3 (three) times daily as needed for pain., Disp: 10 tablet, Rfl: 0   phenazopyridine  (PYRIDIUM ) 95 MG tablet, Take 1 tablet (95 mg total) by mouth 3 (three) times daily as needed for pain., Disp: 10 tablet, Rfl: 0   triamcinolone  cream (KENALOG ) 0.1 %, Apply 1 Application topically., Disp: , Rfl:  [2] No Known Allergies [3]  Social History Tobacco Use   Smoking status: Former    Current packs/day: 0.00    Types: Cigarettes    Quit date: 01/08/2013    Years since quitting: 11.5   Smokeless tobacco: Never  Vaping Use   Vaping status: Never Used  Substance Use Topics   Alcohol use: No    Alcohol/week: 0.0 standard drinks of alcohol    Comment: former   Drug use:  No    Types: Cocaine    Comment: Last use two years ago (2014)     Garner, [place] B, NP 07/24/24 1027

## 2024-07-24 NOTE — Discharge Instructions (Addendum)
°  1. Acute upper respiratory infection (Primary) - POC Covid19/Flu A&B Antigen complete in UC is negative for COVID and influenza. - azelastine  (ASTELIN ) 0.1 % nasal spray; Place 1 spray into both nostrils 2 (two) times daily. Use in each nostril as directed  Dispense: 30 mL; Refill: 1 - predniSONE  (DELTASONE ) 20 MG tablet; Take 2 tablets (40 mg total) by mouth daily for 5 days.  Dispense: 10 tablet; Refill: 0 - amoxicillin -clavulanate (AUGMENTIN ) 875-125 MG tablet; Take 1 tablet by mouth every 12 (twelve) hours for 7 days.  Dispense: 14 tablet; Refill: 0  -Continue to monitor symptoms for any change in severity if there is any escalation of current symptoms or development of new symptoms follow-up in ER for further evaluation and management.

## 2024-07-24 NOTE — ED Triage Notes (Signed)
 Pt reports a cough, facial pressure, and chest congestion x 2.5 days. Has tried Mucinex, mucous pill and a lot of water for relief. States when he normally has sinusitis, he gets an antibiotic.
